# Patient Record
Sex: Female | Born: 1970 | Race: Black or African American | Hispanic: No | Marital: Married | Smoking: Current every day smoker
Health system: Southern US, Community
[De-identification: ages and names within clinical notes are randomized; demographics above are authoritative.]

## PROBLEM LIST (undated history)

## (undated) ENCOUNTER — Inpatient Hospital Stay: Admission: EM | Payer: Self-pay | Source: Home / Self Care

## (undated) DIAGNOSIS — K219 Gastro-esophageal reflux disease without esophagitis: Secondary | ICD-10-CM

## (undated) DIAGNOSIS — R079 Chest pain, unspecified: Secondary | ICD-10-CM

## (undated) DIAGNOSIS — Z8669 Personal history of other diseases of the nervous system and sense organs: Secondary | ICD-10-CM

## (undated) DIAGNOSIS — Z8619 Personal history of other infectious and parasitic diseases: Secondary | ICD-10-CM

## (undated) DIAGNOSIS — Z8659 Personal history of other mental and behavioral disorders: Secondary | ICD-10-CM

## (undated) DIAGNOSIS — Z87898 Personal history of other specified conditions: Secondary | ICD-10-CM

## (undated) DIAGNOSIS — F418 Other specified anxiety disorders: Secondary | ICD-10-CM

## (undated) HISTORY — DX: Personal history of other specified conditions: Z87.898

## (undated) HISTORY — DX: Personal history of other infectious and parasitic diseases: Z86.19

## (undated) HISTORY — DX: Other specified anxiety disorders: F41.8

## (undated) HISTORY — DX: Personal history of other mental and behavioral disorders: Z86.59

## (undated) HISTORY — DX: Gastro-esophageal reflux disease without esophagitis: K21.9

## (undated) HISTORY — DX: Personal history of other diseases of the nervous system and sense organs: Z86.69

---
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
## 1991-08-11 HISTORY — PX: TUBAL LIGATION: SHX77

## 1993-08-10 DIAGNOSIS — Z8659 Personal history of other mental and behavioral disorders: Secondary | ICD-10-CM

## 1993-08-10 HISTORY — DX: Personal history of other mental and behavioral disorders: Z86.59

## 2004-11-07 ENCOUNTER — Encounter: Admission: RE | Admit: 2004-11-07 | Discharge: 2004-11-07 | Payer: Self-pay | Admitting: Family Medicine

## 2004-12-30 ENCOUNTER — Emergency Department (HOSPITAL_COMMUNITY): Admission: EM | Admit: 2004-12-30 | Discharge: 2004-12-30 | Payer: Self-pay | Admitting: Emergency Medicine

## 2005-07-01 ENCOUNTER — Other Ambulatory Visit: Admission: RE | Admit: 2005-07-01 | Discharge: 2005-07-01 | Payer: Self-pay | Admitting: *Deleted

## 2007-09-07 ENCOUNTER — Other Ambulatory Visit: Admission: RE | Admit: 2007-09-07 | Discharge: 2007-09-07 | Payer: Self-pay | Admitting: Obstetrics and Gynecology

## 2008-10-25 ENCOUNTER — Other Ambulatory Visit: Admission: RE | Admit: 2008-10-25 | Discharge: 2008-10-25 | Payer: Self-pay | Admitting: Family Medicine

## 2008-12-01 ENCOUNTER — Emergency Department (HOSPITAL_BASED_OUTPATIENT_CLINIC_OR_DEPARTMENT_OTHER): Admission: EM | Admit: 2008-12-01 | Discharge: 2008-12-01 | Payer: Self-pay | Admitting: Emergency Medicine

## 2008-12-01 ENCOUNTER — Ambulatory Visit: Payer: Self-pay | Admitting: Diagnostic Radiology

## 2009-10-31 ENCOUNTER — Other Ambulatory Visit: Admission: RE | Admit: 2009-10-31 | Discharge: 2009-10-31 | Payer: Self-pay | Admitting: Family Medicine

## 2009-11-20 ENCOUNTER — Encounter: Admission: RE | Admit: 2009-11-20 | Discharge: 2009-11-20 | Payer: Self-pay | Admitting: Family Medicine

## 2009-11-22 ENCOUNTER — Encounter: Admission: RE | Admit: 2009-11-22 | Discharge: 2009-11-22 | Payer: Self-pay | Admitting: Family Medicine

## 2010-04-30 ENCOUNTER — Emergency Department (HOSPITAL_COMMUNITY): Admission: EM | Admit: 2010-04-30 | Discharge: 2010-04-30 | Payer: Self-pay | Admitting: Emergency Medicine

## 2010-10-23 LAB — POCT CARDIAC MARKERS

## 2010-11-12 ENCOUNTER — Other Ambulatory Visit: Payer: Self-pay | Admitting: Family Medicine

## 2010-11-12 ENCOUNTER — Other Ambulatory Visit (HOSPITAL_COMMUNITY)
Admission: RE | Admit: 2010-11-12 | Discharge: 2010-11-12 | Disposition: A | Discharge status: Home / Self Care | Payer: Self-pay | Source: Ambulatory Visit | Attending: Family Medicine | Admitting: Family Medicine

## 2010-11-12 DIAGNOSIS — Z124 Encounter for screening for malignant neoplasm of cervix: Secondary | ICD-10-CM | POA: Insufficient documentation

## 2010-11-17 ENCOUNTER — Emergency Department (HOSPITAL_BASED_OUTPATIENT_CLINIC_OR_DEPARTMENT_OTHER)
Admission: EM | Admit: 2010-11-17 | Discharge: 2010-11-18 | Disposition: A | Discharge status: Home / Self Care | Payer: Self-pay | Attending: Emergency Medicine | Admitting: Emergency Medicine

## 2010-11-17 ENCOUNTER — Emergency Department (INDEPENDENT_AMBULATORY_CARE_PROVIDER_SITE_OTHER): Payer: Self-pay

## 2010-11-17 DIAGNOSIS — R51 Headache: Secondary | ICD-10-CM

## 2010-11-17 DIAGNOSIS — F17200 Nicotine dependence, unspecified, uncomplicated: Secondary | ICD-10-CM | POA: Insufficient documentation

## 2010-11-17 LAB — PREGNANCY, URINE: Preg Test, Ur: NEGATIVE

## 2010-11-18 LAB — SEDIMENTATION RATE: Sed Rate: 5 mm/hr (ref 0–22)

## 2011-08-05 ENCOUNTER — Other Ambulatory Visit: Payer: Self-pay | Admitting: Internal Medicine

## 2011-08-05 DIAGNOSIS — Z1231 Encounter for screening mammogram for malignant neoplasm of breast: Secondary | ICD-10-CM

## 2011-08-06 ENCOUNTER — Encounter: Payer: Self-pay | Admitting: Internal Medicine

## 2011-08-06 ENCOUNTER — Telehealth: Payer: Self-pay | Admitting: Internal Medicine

## 2011-08-06 ENCOUNTER — Ambulatory Visit (INDEPENDENT_AMBULATORY_CARE_PROVIDER_SITE_OTHER): Payer: No Typology Code available for payment source | Admitting: Internal Medicine

## 2011-08-06 VITALS — BP 100/70 | HR 62 | Temp 98.0°F | Resp 16 | Ht 65.0 in | Wt 141.0 lb

## 2011-08-06 DIAGNOSIS — Z0000 Encounter for general adult medical examination without abnormal findings: Secondary | ICD-10-CM

## 2011-08-06 DIAGNOSIS — K219 Gastro-esophageal reflux disease without esophagitis: Secondary | ICD-10-CM

## 2011-08-06 DIAGNOSIS — R070 Pain in throat: Secondary | ICD-10-CM

## 2011-08-06 DIAGNOSIS — G43909 Migraine, unspecified, not intractable, without status migrainosus: Secondary | ICD-10-CM

## 2011-08-06 DIAGNOSIS — J309 Allergic rhinitis, unspecified: Secondary | ICD-10-CM

## 2011-08-06 MED ORDER — AZITHROMYCIN 250 MG PO TABS: ORAL_TABLET | ORAL | Status: AC

## 2011-08-06 MED ORDER — MONTELUKAST SODIUM 10 MG PO TABS: 10.0000 mg | ORAL_TABLET | Freq: Every day | ORAL | Status: DC

## 2011-08-06 MED ORDER — OMEPRAZOLE MAGNESIUM 20 MG PO TBEC: 20.0000 mg | DELAYED_RELEASE_TABLET | Freq: Every day | ORAL | Status: DC

## 2011-08-06 NOTE — Patient Instructions (Signed)
Please schedule cbc, chem7, lipid, lft, tsh, ua with reflex micro (v70.0) prior to your spring physical

## 2011-08-06 NOTE — Telephone Encounter (Signed)
Lab orders entered for April 2013. 

## 2011-08-06 NOTE — Progress Notes (Signed)
  Subjective:    Patient ID: Amy Bishop, female    DOB: [birth year], 40 y.o.   MRN: [id number]  HPI Pt presents to establish care and for evaluation of multiple medical problems. Notes 4d h/o ST, left ear pain without drainage and swollen neck glands. No fever, chills, cough. No alleviating or exacerbating factors. Rhinitis controlled with zyrtec and singulair. Pap smear nl spring 2012 and is pursuing q2 year repeats. Tetanus within last 10 y. Mammogram pending. States chol has been normal in the past. Declines flu vaccine.  Past Medical History  Diagnosis Date  . History of chicken pox     childhood  . Depression with anxiety   . History of anorexia nervosa 1995  . History of headache   . GERD (gastroesophageal reflux disease)   . Allergic rhinitis   . History of migraines    Past Surgical History  Procedure Date  . Cesarean section 72' & 10'  . Tubal ligation 1993  . Appendectomy 1980    reports that she has been smoking.  She has never used smokeless tobacco. She reports that she drinks alcohol. She reports that she does not use illicit drugs. family history includes Diabetes in her mother and Hypertension in her maternal grandmother and mother. Allergies  Allergen Reactions  . Bactrim     Hives  . Sulfa Drugs Cross Reactors      Review of Systems  Constitutional: Negative for fever and chills.  HENT: Positive for ear pain, congestion and sore throat. Negative for trouble swallowing.   Respiratory: Negative for cough and shortness of breath.   All other systems reviewed and are negative.       Objective:   Physical Exam  Nursing note and vitals reviewed. Constitutional: She appears well-developed and well-nourished. No distress.  HENT:  Head: Normocephalic and atraumatic.  Right Ear: Tympanic membrane, external ear and ear canal normal.  Left Ear: Tympanic membrane, external ear and ear canal normal.  Nose: Nose normal.  Mouth/Throat: Uvula is midline and  mucous membranes are normal. Posterior oropharyngeal erythema present. No oropharyngeal exudate or tonsillar abscesses.  Eyes: Conjunctivae are normal. Right eye exhibits no discharge. Left eye exhibits no discharge. No scleral icterus.  Neck: Neck supple.  Cardiovascular: Normal rate, regular rhythm and normal heart sounds.  Exam reveals no gallop and no friction rub.   No murmur heard. Pulmonary/Chest: Effort normal and breath sounds normal. No respiratory distress. She has no wheezes. She has no rales.  Lymphadenopathy:    She has no cervical adenopathy.  Neurological: She is alert.  Skin: Skin is warm and dry. She is not diaphoretic.  Psychiatric: She has a normal mood and affect.          Assessment & Plan:

## 2011-08-11 ENCOUNTER — Encounter: Payer: Self-pay | Admitting: Internal Medicine

## 2011-08-11 DIAGNOSIS — R070 Pain in throat: Secondary | ICD-10-CM | POA: Insufficient documentation

## 2011-08-11 DIAGNOSIS — K219 Gastro-esophageal reflux disease without esophagitis: Secondary | ICD-10-CM | POA: Insufficient documentation

## 2011-08-11 DIAGNOSIS — G43909 Migraine, unspecified, not intractable, without status migrainosus: Secondary | ICD-10-CM | POA: Insufficient documentation

## 2011-08-11 DIAGNOSIS — J309 Allergic rhinitis, unspecified: Secondary | ICD-10-CM | POA: Insufficient documentation

## 2011-08-11 NOTE — Assessment & Plan Note (Signed)
Rapid strep neg. zpak given to hold. Begin abx if no improvement after total duration of 7-10d. Followup if no improvement or worsening.

## 2011-08-11 NOTE — Assessment & Plan Note (Signed)
Stable. Ref singulair. F/u at spring cpe.

## 2011-08-12 ENCOUNTER — Ambulatory Visit (HOSPITAL_BASED_OUTPATIENT_CLINIC_OR_DEPARTMENT_OTHER): Payer: Self-pay

## 2011-08-20 ENCOUNTER — Ambulatory Visit: Payer: Self-pay

## 2011-11-27 ENCOUNTER — Encounter (HOSPITAL_BASED_OUTPATIENT_CLINIC_OR_DEPARTMENT_OTHER): Payer: Self-pay

## 2011-11-27 ENCOUNTER — Emergency Department (HOSPITAL_BASED_OUTPATIENT_CLINIC_OR_DEPARTMENT_OTHER)
Admission: EM | Admit: 2011-11-27 | Discharge: 2011-11-27 | Disposition: A | Discharge status: Home / Self Care | Payer: No Typology Code available for payment source | Attending: Emergency Medicine | Admitting: Emergency Medicine

## 2011-11-27 DIAGNOSIS — R070 Pain in throat: Secondary | ICD-10-CM | POA: Insufficient documentation

## 2011-11-27 DIAGNOSIS — M549 Dorsalgia, unspecified: Secondary | ICD-10-CM | POA: Insufficient documentation

## 2011-11-27 NOTE — ED Notes (Signed)
Pt states that she had episode of sore throat and bilateral lower back pain for the past few days.  Pt denies dysuria, n/v/d, c/o constipation, no fever.

## 2011-11-30 ENCOUNTER — Encounter: Payer: No Typology Code available for payment source | Admitting: Internal Medicine

## 2011-12-03 ENCOUNTER — Encounter: Payer: No Typology Code available for payment source | Admitting: Internal Medicine

## 2011-12-03 DIAGNOSIS — Z0289 Encounter for other administrative examinations: Secondary | ICD-10-CM

## 2012-10-01 ENCOUNTER — Other Ambulatory Visit: Payer: Self-pay | Admitting: Internal Medicine

## 2013-09-13 ENCOUNTER — Other Ambulatory Visit (HOSPITAL_COMMUNITY): Payer: Self-pay | Admitting: Endocrinology

## 2013-09-13 DIAGNOSIS — E213 Hyperparathyroidism, unspecified: Secondary | ICD-10-CM

## 2013-10-02 ENCOUNTER — Encounter (HOSPITAL_COMMUNITY)
Admission: RE | Admit: 2013-10-02 | Discharge: 2013-10-02 | Disposition: A | Discharge status: Home / Self Care | Payer: BC Managed Care – PPO | Source: Ambulatory Visit | Attending: Endocrinology | Admitting: Endocrinology

## 2013-10-02 ENCOUNTER — Ambulatory Visit (HOSPITAL_COMMUNITY)
Admission: RE | Admit: 2013-10-02 | Discharge: 2013-10-02 | Disposition: A | Discharge status: Home / Self Care | Payer: BC Managed Care – PPO | Source: Ambulatory Visit | Attending: Endocrinology | Admitting: Endocrinology

## 2013-10-02 DIAGNOSIS — E213 Hyperparathyroidism, unspecified: Secondary | ICD-10-CM | POA: Insufficient documentation

## 2013-10-02 MED ORDER — TECHNETIUM TC 99M SESTAMIBI - CARDIOLITE
26.1000 | Freq: Once | INTRAVENOUS | Status: AC | PRN
  Administered 2013-10-02: 26.1 via INTRAVENOUS

## 2013-11-01 ENCOUNTER — Encounter (INDEPENDENT_AMBULATORY_CARE_PROVIDER_SITE_OTHER): Payer: Self-pay | Admitting: Surgery

## 2013-11-01 ENCOUNTER — Ambulatory Visit (INDEPENDENT_AMBULATORY_CARE_PROVIDER_SITE_OTHER): Payer: BC Managed Care – PPO | Admitting: Surgery

## 2013-11-01 VITALS — BP 120/82 | HR 72 | Temp 98.1°F | Resp 14 | Ht 64.0 in | Wt 152.4 lb

## 2013-11-01 DIAGNOSIS — E210 Primary hyperparathyroidism: Secondary | ICD-10-CM | POA: Insufficient documentation

## 2013-11-01 NOTE — Progress Notes (Signed)
General Surgery [facility]  Chief Complaint  Patient presents with  . New Evaluation    eval primary hyperthyroidism - referral from Dr. Jacelyn Pi    HISTORY: Patient is a 43 year old female referred by her endocrinologist for evaluation of primary hyperparathyroidism. Patient was noted in November 2014 on routine laboratory studies to have an elevated serum calcium level. Calcium levels have ranged from 10.6-11.0. Intact PTH level was elevated at 190. Vitamin D level was normal at 35.  Patient subsequently underwent a nuclear medicine parathyroid scan in February 2015. This localizes a parathyroid adenoma to the right inferior position.  Patient has noted chronic fatigue. She has had abdominal pain. She notes bone and joint pain. She denies nephrolithiasis. She has had a normal bone density scan in the past.  Patient has had no prior head or neck surgery. She has never been on thyroid medication. There is no family history of endocrine disease and specifically no history of endocrine neoplasm.  Past Medical History  Diagnosis Date  . History of chicken pox     childhood  . Depression with anxiety   . History of anorexia nervosa 1995  . History of headache   . GERD (gastroesophageal reflux disease)   . Allergic rhinitis   . History of migraines     Current Outpatient Prescriptions  Medication Sig Dispense Refill  . baclofen (LIORESAL) 20 MG tablet Take 20 mg by mouth 3 (three) times daily.      Marland Kitchen buPROPion (WELLBUTRIN SR) 150 MG 12 hr tablet       . cetirizine (ZYRTEC) 10 MG tablet Take 10 mg by mouth daily as needed.        . cholecalciferol (VITAMIN D) 1000 UNITS tablet Take 1,000 Units by mouth 2 (two) times daily.      . montelukast (SINGULAIR) 10 MG tablet Take 1 tablet (10 mg total) by mouth at bedtime.  30 tablet  11  . omeprazole (PRILOSEC OTC) 20 MG tablet Take 1 tablet (20 mg total) by mouth daily.  30 tablet  11  . omeprazole (PRILOSEC) 20 MG  capsule        No current facility-administered medications for this visit.    Allergies  Allergen Reactions  . Bactrim     Hives  . Sulfa Drugs Cross Reactors     Family History  Problem Relation Age of Onset  . Hypertension Mother   . Diabetes Mother   . Hypertension Maternal Grandmother     History   Social History  . Marital Status: Married    Spouse Name: N/A    Number of Children: N/A  . Years of Education: N/A   Social History Main Topics  . Smoking status: Current Every Day Smoker -- 0.75 packs/day for 20 years    Types: Cigarettes  . Smokeless tobacco: Never Used  . Alcohol Use: Yes     Comment: occ  . Drug Use: No  . Sexual Activity: Yes    Birth Control/ Protection: Surgical   Other Topics Concern  . None   Social History Narrative  . None    REVIEW OF SYSTEMS - PERTINENT POSITIVES ONLY: Chronic fatigue. Chronic abdominal pain. Bone and joint pain. Denies nephrolithiasis. Denies fractures.  EXAM: Filed Vitals:   11/01/13 0955  BP: 120/82  Pulse: 72  Temp: 98.1 F (36.7 C)  Resp: 14    GENERAL: well-developed, well-nourished, no acute distress HEENT: normocephalic; pupils equal and reactive; sclerae clear; dentition good;  mucous membranes moist NECK:  No palpable masses in the thyroid bed; symmetric on extension; no palpable anterior or posterior cervical lymphadenopathy; no supraclavicular masses; no tenderness CHEST: clear to auscultation bilaterally without rales, rhonchi, or wheezes CARDIAC: regular rate and rhythm without significant murmur; peripheral pulses are full EXT:  non-tender without edema; no deformity NEURO: no gross focal deficits; no sign of tremor   LABORATORY RESULTS: See [facility] (CHL-Epic) for most recent results  RADIOLOGY RESULTS: See [facility] (CHL-Epic) for most recent results  IMPRESSION: Primary hyperparathyroidism, likely right inferior parathyroid adenoma  PLAN: The patient and I discussed  the above findings at length. We reviewed her laboratory studies and the sestamibi scan. We discussed minimally invasive surgery versus 4 gland exploration. I have recommended minimally invasive parathyroidectomy as an outpatient surgical procedure. We discussed the risk and benefits of the procedure. I provided her with written literature to review. Patient would like to proceed with surgery in the near future.  The risks and benefits of the procedure have been discussed at length with the patient.  The patient understands the proposed procedure, potential alternative treatments, and the course of recovery to be expected.  All of the patient's questions have been answered at this time.  The patient wishes to proceed with surgery.  Earnstine Regal, MD, [facility]  Primary Care Physician: Dennie Bible, PA-C

## 2013-11-01 NOTE — Patient Instructions (Signed)

## 2013-12-27 ENCOUNTER — Encounter (HOSPITAL_COMMUNITY): Payer: Self-pay | Admitting: Pharmacy Technician

## 2013-12-29 ENCOUNTER — Other Ambulatory Visit (HOSPITAL_COMMUNITY): Payer: Self-pay | Admitting: Anesthesiology

## 2013-12-29 NOTE — Progress Notes (Signed)
Chest 2 view xray 06-23-13 [facility] on chart ekg 06-01-13 [facility] on chart

## 2013-12-29 NOTE — Patient Instructions (Addendum)
[place]  12/29/2013   Your procedure is scheduled on: Thursday June 4th, 2015  Report to [facility] Main Entrance and follow signs to  [place] at 530 AM.  Call this number if you have problems the morning of surgery [phone]   Remember:  Do not eat food or drink liquids :After Midnight.     Take these medicines the morning of surgery with A SIP OF WATER: wellbutrin, prilosec                               You may not have any metal on your body including hair pins and piercings  Do not wear jewelry, make-up, lotions, powders, or deodorant.   Men may shave face and neck.  Do not bring valuables to the hospital. [place].  Contacts, dentures or bridgework may not be worn into surgery.  Leave suitcase in the car. After surgery it may be brought to your room.  For patients admitted to the hospital, checkout time is 11:00 AM the day of discharge.   Patients discharged the day of surgery will not be allowed to drive home.  Name and phone number of your driver:  Special Instructions: N/A  ________________________________________________________________________  [facility] - Preparing for Surgery Before surgery, you can play an important role.  Because skin is not sterile, your skin needs to be as free of germs as possible.  You can reduce the number of germs on your skin by washing with CHG (chlorahexidine gluconate) soap before surgery.  CHG is an antiseptic cleaner which kills germs and bonds with the skin to continue killing germs even after washing. Please DO NOT use if you have an allergy to CHG or antibacterial soaps.  If your skin becomes reddened/irritated stop using the CHG and inform your nurse when you arrive at Short Stay. Do not shave (including legs and underarms) for at least 48 hours prior to the first CHG shower.  You may shave your face/neck. Please follow these instructions carefully:  1.  Shower with  CHG Soap the night before surgery and the  morning of Surgery.  2.  If you choose to wash your hair, wash your hair first as usual with your  normal  shampoo.  3.  After you shampoo, rinse your hair and body thoroughly to remove the  shampoo.                           4.  Use CHG as you would any other liquid soap.  You can apply chg directly  to the skin and wash                       Gently with a scrungie or clean washcloth.  5.  Apply the CHG Soap to your body ONLY FROM THE NECK DOWN.   Do not use on face/ open                           Wound or open sores. Avoid contact with eyes, ears mouth and genitals (private parts).                       Wash face,  Genitals (private parts) with your normal soap.  6.  Wash thoroughly, paying special attention to the area where your surgery  will be performed.  7.  Thoroughly rinse your body with warm water from the neck down.  8.  DO NOT shower/wash with your normal soap after using and rinsing off  the CHG Soap.                9.  Pat yourself dry with a clean towel.            10.  Wear clean pajamas.            11.  Place clean sheets on your bed the night of your first shower and do not  sleep with pets. Day of Surgery : Do not apply any lotions/deodorants the morning of surgery.  Please wear clean clothes to the hospital/surgery center.  FAILURE TO FOLLOW THESE INSTRUCTIONS MAY RESULT IN THE CANCELLATION OF YOUR SURGERY PATIENT SIGNATURE_________________________________  NURSE SIGNATURE__________________________________  ________________________________________________________________________

## 2014-01-02 ENCOUNTER — Encounter (HOSPITAL_COMMUNITY)
Admission: RE | Admit: 2014-01-02 | Discharge: 2014-01-02 | Disposition: A | Discharge status: Home / Self Care | Payer: BC Managed Care – PPO | Source: Ambulatory Visit | Attending: Surgery | Admitting: Surgery

## 2014-01-02 ENCOUNTER — Encounter (HOSPITAL_COMMUNITY): Payer: Self-pay

## 2014-01-02 ENCOUNTER — Encounter (INDEPENDENT_AMBULATORY_CARE_PROVIDER_SITE_OTHER): Payer: Self-pay

## 2014-01-02 DIAGNOSIS — Z01812 Encounter for preprocedural laboratory examination: Secondary | ICD-10-CM | POA: Insufficient documentation

## 2014-01-02 DIAGNOSIS — Z01810 Encounter for preprocedural cardiovascular examination: Secondary | ICD-10-CM | POA: Insufficient documentation

## 2014-01-02 HISTORY — DX: Chest pain, unspecified: R07.9

## 2014-01-02 LAB — CBC
HEMATOCRIT: 36.2 % (ref 36.0–46.0)
HEMOGLOBIN: 11.3 g/dL — AB (ref 12.0–15.0)
MCH: 22.1 pg — ABNORMAL LOW (ref 26.0–34.0)
MCHC: 31.2 g/dL (ref 30.0–36.0)
MCV: 70.7 fL — AB (ref 78.0–100.0)
Platelets: 247 10*3/uL (ref 150–400)
RBC: 5.12 MIL/uL — ABNORMAL HIGH (ref 3.87–5.11)
RDW: 15.3 % (ref 11.5–15.5)
WBC: 6.9 10*3/uL (ref 4.0–10.5)

## 2014-01-02 LAB — BASIC METABOLIC PANEL
BUN: 14 mg/dL (ref 6–23)
CO2: 27 meq/L (ref 19–32)
Calcium: 11 mg/dL — ABNORMAL HIGH (ref 8.4–10.5)
Chloride: 105 mEq/L (ref 96–112)
Creatinine, Ser: 0.9 mg/dL (ref 0.50–1.10)
GFR calc Af Amer: 89 mL/min — ABNORMAL LOW (ref >90)
GFR calc non Af Amer: 77 mL/min — ABNORMAL LOW (ref >90)
GLUCOSE: 85 mg/dL (ref 70–99)
POTASSIUM: 4.5 meq/L (ref 3.7–5.3)
SODIUM: 142 meq/L (ref 137–147)

## 2014-01-02 LAB — HCG, SERUM, QUALITATIVE: Preg, Serum: NEGATIVE

## 2014-01-02 NOTE — Progress Notes (Signed)
Quick Note:  EKG is acceptable for scheduled surgery.  Kaj Vasil M. Nikolis Berent, MD, FACS [facility] Office: [phone]   ______ 

## 2014-01-02 NOTE — Progress Notes (Signed)
Patient c/o chest pain few days ago, ekg repeated at pre op visit 01-02-14

## 2014-01-02 NOTE — Progress Notes (Signed)
Quick Note:  These results are acceptable for scheduled surgery.  Pretty Weltman M. Jaqualin Serpa, MD, FACS [facility] Office: [phone]   ______ 

## 2014-01-10 NOTE — Anesthesia Preprocedure Evaluation (Addendum)
Anesthesia Evaluation  Patient identified by MRN, date of birth, ID band Patient awake    Reviewed: Allergy & Precautions, H&P , NPO status , Patient's Chart, lab work & pertinent test results  Airway Mallampati: II TM Distance: >3 FB Neck ROM: Full    Dental no notable dental hx.    Pulmonary Current Smoker,  breath sounds clear to auscultation  Pulmonary exam normal       Cardiovascular negative cardio ROS  Rhythm:Regular Rate:Normal     Neuro/Psych negative neurological ROS  negative psych ROS   GI/Hepatic negative GI ROS, Neg liver ROS, GERD-  Medicated,  Endo/Other  negative endocrine ROS  Renal/GU negative Renal ROS  negative genitourinary   Musculoskeletal negative musculoskeletal ROS (+)   Abdominal   Peds negative pediatric ROS (+)  Hematology negative hematology ROS (+)   Anesthesia Other Findings   Reproductive/Obstetrics negative OB ROS                          Anesthesia Physical Anesthesia Plan  ASA: II  Anesthesia Plan: General   Post-op Pain Management:    Induction: Intravenous  Airway Management Planned: Oral ETT  Additional Equipment:   Intra-op Plan:   Post-operative Plan: Extubation in OR  Informed Consent: I have reviewed the patients History and Physical, chart, labs and discussed the procedure including the risks, benefits and alternatives for the proposed anesthesia with the patient or authorized representative who has indicated his/her understanding and acceptance.   Dental advisory given  Plan Discussed with: CRNA and Surgeon  Anesthesia Plan Comments:         Anesthesia Quick Evaluation

## 2014-01-11 ENCOUNTER — Telehealth (INDEPENDENT_AMBULATORY_CARE_PROVIDER_SITE_OTHER): Payer: Self-pay

## 2014-01-11 ENCOUNTER — Telehealth (INDEPENDENT_AMBULATORY_CARE_PROVIDER_SITE_OTHER): Payer: Self-pay | Admitting: General Surgery

## 2014-01-11 ENCOUNTER — Encounter (HOSPITAL_COMMUNITY): Payer: BC Managed Care – PPO | Admitting: Anesthesiology

## 2014-01-11 ENCOUNTER — Encounter (HOSPITAL_COMMUNITY): Payer: Self-pay | Admitting: *Deleted

## 2014-01-11 ENCOUNTER — Ambulatory Visit (HOSPITAL_COMMUNITY)
Admission: RE | Admit: 2014-01-11 | Discharge: 2014-01-11 | Disposition: A | Discharge status: Home / Self Care | Payer: BC Managed Care – PPO | Source: Ambulatory Visit | Attending: Surgery | Admitting: Surgery

## 2014-01-11 ENCOUNTER — Ambulatory Visit (HOSPITAL_COMMUNITY): Payer: BC Managed Care – PPO | Admitting: Anesthesiology

## 2014-01-11 ENCOUNTER — Other Ambulatory Visit (INDEPENDENT_AMBULATORY_CARE_PROVIDER_SITE_OTHER): Payer: Self-pay

## 2014-01-11 ENCOUNTER — Encounter (HOSPITAL_COMMUNITY)
Admission: RE | Disposition: A | Discharge status: Home / Self Care | Payer: Self-pay | Source: Ambulatory Visit | Attending: Surgery

## 2014-01-11 DIAGNOSIS — F341 Dysthymic disorder: Secondary | ICD-10-CM | POA: Insufficient documentation

## 2014-01-11 DIAGNOSIS — D351 Benign neoplasm of parathyroid gland: Secondary | ICD-10-CM

## 2014-01-11 DIAGNOSIS — K219 Gastro-esophageal reflux disease without esophagitis: Secondary | ICD-10-CM | POA: Insufficient documentation

## 2014-01-11 DIAGNOSIS — E210 Primary hyperparathyroidism: Secondary | ICD-10-CM | POA: Insufficient documentation

## 2014-01-11 DIAGNOSIS — Z79899 Other long term (current) drug therapy: Secondary | ICD-10-CM | POA: Insufficient documentation

## 2014-01-11 DIAGNOSIS — F17200 Nicotine dependence, unspecified, uncomplicated: Secondary | ICD-10-CM | POA: Insufficient documentation

## 2014-01-11 HISTORY — PX: PARATHYROIDECTOMY: SHX19

## 2014-01-11 SURGERY — PARATHYROIDECTOMY
Anesthesia: General | Site: Neck | Laterality: Right

## 2014-01-11 MED ORDER — LIDOCAINE HCL (CARDIAC) 20 MG/ML IV SOLN
INTRAVENOUS | Status: DC | PRN
  Administered 2014-01-11: 100 mg via INTRAVENOUS

## 2014-01-11 MED ORDER — PROMETHAZINE HCL 25 MG/ML IJ SOLN
6.2500 mg | Freq: Once | INTRAMUSCULAR | Status: DC
  Filled 2014-01-11: qty 1

## 2014-01-11 MED ORDER — METOCLOPRAMIDE HCL 5 MG/ML IJ SOLN
10.0000 mg | Freq: Once | INTRAMUSCULAR | Status: DC
  Filled 2014-01-11: qty 2

## 2014-01-11 MED ORDER — SUCCINYLCHOLINE CHLORIDE 20 MG/ML IJ SOLN
INTRAMUSCULAR | Status: DC | PRN
  Administered 2014-01-11: 100 mg via INTRAVENOUS

## 2014-01-11 MED ORDER — LACTATED RINGERS IV SOLN
INTRAVENOUS | Status: DC
  Administered 2014-01-11: 09:00:00 via INTRAVENOUS

## 2014-01-11 MED ORDER — ACETAMINOPHEN 10 MG/ML IV SOLN
1000.0000 mg | Freq: Once | INTRAVENOUS | Status: AC
  Administered 2014-01-11: 1000 mg via INTRAVENOUS
  Filled 2014-01-11: qty 100

## 2014-01-11 MED ORDER — FENTANYL CITRATE 0.05 MG/ML IJ SOLN
INTRAMUSCULAR | Status: DC | PRN
  Administered 2014-01-11 (×5): 50 ug via INTRAVENOUS

## 2014-01-11 MED ORDER — HYDROMORPHONE HCL PF 1 MG/ML IJ SOLN
INTRAMUSCULAR | Status: AC
  Filled 2014-01-11: qty 1

## 2014-01-11 MED ORDER — NEOSTIGMINE METHYLSULFATE 10 MG/10ML IV SOLN
INTRAVENOUS | Status: DC | PRN
  Administered 2014-01-11: 4 mg via INTRAVENOUS

## 2014-01-11 MED ORDER — ONDANSETRON HCL 4 MG/2ML IJ SOLN
INTRAMUSCULAR | Status: DC | PRN
  Administered 2014-01-11: 4 mg via INTRAVENOUS

## 2014-01-11 MED ORDER — HYDROCODONE-ACETAMINOPHEN 5-325 MG PO TABS
1.0000 | ORAL_TABLET | ORAL | Status: DC | PRN
  Administered 2014-01-11: 1 via ORAL
  Filled 2014-01-11: qty 1

## 2014-01-11 MED ORDER — FENTANYL CITRATE 0.05 MG/ML IJ SOLN
INTRAMUSCULAR | Status: AC
  Filled 2014-01-11: qty 5

## 2014-01-11 MED ORDER — LACTATED RINGERS IV SOLN
INTRAVENOUS | Status: DC | PRN
  Administered 2014-01-11: 07:00:00 via INTRAVENOUS

## 2014-01-11 MED ORDER — BUPIVACAINE HCL (PF) 0.25 % IJ SOLN
INTRAMUSCULAR | Status: AC
  Filled 2014-01-11: qty 30

## 2014-01-11 MED ORDER — GLYCOPYRROLATE 0.2 MG/ML IJ SOLN
INTRAMUSCULAR | Status: DC | PRN
  Administered 2014-01-11: 0.6 mg via INTRAVENOUS

## 2014-01-11 MED ORDER — DEXAMETHASONE SODIUM PHOSPHATE 10 MG/ML IJ SOLN
INTRAMUSCULAR | Status: DC | PRN
Start: 2014-01-11 — End: 2014-01-11
  Administered 2014-01-11: 10 mg via INTRAVENOUS

## 2014-01-11 MED ORDER — ONDANSETRON HCL 4 MG/2ML IJ SOLN
INTRAMUSCULAR | Status: AC
  Filled 2014-01-11: qty 2

## 2014-01-11 MED ORDER — GLYCOPYRROLATE 0.2 MG/ML IJ SOLN
INTRAMUSCULAR | Status: AC
  Filled 2014-01-11: qty 3

## 2014-01-11 MED ORDER — LIDOCAINE HCL (CARDIAC) 20 MG/ML IV SOLN
INTRAVENOUS | Status: AC
  Filled 2014-01-11: qty 5

## 2014-01-11 MED ORDER — PROPOFOL 10 MG/ML IV BOLUS
INTRAVENOUS | Status: AC
  Filled 2014-01-11: qty 20

## 2014-01-11 MED ORDER — HYDROMORPHONE HCL PF 1 MG/ML IJ SOLN
0.2500 mg | INTRAMUSCULAR | Status: DC | PRN
  Administered 2014-01-11 (×3): 0.5 mg via INTRAVENOUS

## 2014-01-11 MED ORDER — ROCURONIUM BROMIDE 100 MG/10ML IV SOLN
INTRAVENOUS | Status: DC | PRN
  Administered 2014-01-11: 5 mg via INTRAVENOUS
  Administered 2014-01-11: 20 mg via INTRAVENOUS

## 2014-01-11 MED ORDER — CEFAZOLIN SODIUM-DEXTROSE 2-3 GM-% IV SOLR
INTRAVENOUS | Status: AC
  Filled 2014-01-11: qty 50

## 2014-01-11 MED ORDER — DEXAMETHASONE SODIUM PHOSPHATE 10 MG/ML IJ SOLN
INTRAMUSCULAR | Status: AC
  Filled 2014-01-11: qty 1

## 2014-01-11 MED ORDER — PROMETHAZINE HCL 25 MG/ML IJ SOLN: 6.2500 mg | INTRAMUSCULAR | Status: DC | PRN

## 2014-01-11 MED ORDER — BUPIVACAINE HCL 0.25 % IJ SOLN
INTRAMUSCULAR | Status: DC | PRN
  Administered 2014-01-11: 10 mL

## 2014-01-11 MED ORDER — CEFAZOLIN SODIUM-DEXTROSE 2-3 GM-% IV SOLR
2.0000 g | INTRAVENOUS | Status: AC
  Administered 2014-01-11: 2 g via INTRAVENOUS

## 2014-01-11 MED ORDER — HYDROCODONE-ACETAMINOPHEN 5-325 MG PO TABS: 1.0000 | ORAL_TABLET | ORAL | Status: DC | PRN

## 2014-01-11 MED ORDER — MIDAZOLAM HCL 2 MG/2ML IJ SOLN
INTRAMUSCULAR | Status: AC
  Filled 2014-01-11: qty 2

## 2014-01-11 MED ORDER — PROPOFOL 10 MG/ML IV BOLUS
INTRAVENOUS | Status: DC | PRN
  Administered 2014-01-11: 150 mg via INTRAVENOUS

## 2014-01-11 MED ORDER — MIDAZOLAM HCL 5 MG/5ML IJ SOLN
INTRAMUSCULAR | Status: DC | PRN
  Administered 2014-01-11: 2 mg via INTRAVENOUS

## 2014-01-11 SURGICAL SUPPLY — 41 items
APL SKNCLS STERI-STRIP NONHPOA (GAUZE/BANDAGES/DRESSINGS) ×1
ATTRACTOMAT 16X20 MAGNETIC DRP (DRAPES) ×3 IMPLANT
BENZOIN TINCTURE PRP APPL 2/3 (GAUZE/BANDAGES/DRESSINGS) ×3 IMPLANT
BLADE HEX COATED 2.75 (ELECTRODE) ×3 IMPLANT
BLADE SURG 15 STRL LF DISP TIS (BLADE) ×1 IMPLANT
BLADE SURG 15 STRL SS (BLADE) ×3
CANISTER SUCTION 2500CC (MISCELLANEOUS) ×1 IMPLANT
CHLORAPREP W/TINT 10.5 ML (MISCELLANEOUS) ×3 IMPLANT
CLIP TI MEDIUM 6 (CLIP) ×4 IMPLANT
CLIP TI WIDE RED SMALL 6 (CLIP) ×4 IMPLANT
CLOSURE WOUND 1/2 X4 (GAUZE/BANDAGES/DRESSINGS) ×1
DRAPE PED LAPAROTOMY (DRAPES) ×3 IMPLANT
DRESSING SURGICEL FIBRLLR 1X2 (HEMOSTASIS) ×1 IMPLANT
DRSG SURGICEL FIBRILLAR 1X2 (HEMOSTASIS) ×3
ELECT REM PT RETURN 9FT ADLT (ELECTROSURGICAL) ×3
ELECTRODE REM PT RTRN 9FT ADLT (ELECTROSURGICAL) ×1 IMPLANT
GAUZE SPONGE 4X4 16PLY XRAY LF (GAUZE/BANDAGES/DRESSINGS) ×3 IMPLANT
GLOVE SURG ORTHO 8.0 STRL STRW (GLOVE) ×3 IMPLANT
GOWN STRL REUS W/TWL XL LVL3 (GOWN DISPOSABLE) ×7 IMPLANT
KIT BASIN OR (CUSTOM PROCEDURE TRAY) ×3 IMPLANT
NDL HYPO 25X1 1.5 SAFETY (NEEDLE) ×1 IMPLANT
NEEDLE HYPO 25X1 1.5 SAFETY (NEEDLE) ×3 IMPLANT
NS IRRIG 1000ML POUR BTL (IV SOLUTION) ×3 IMPLANT
PACK BASIC VI WITH GOWN DISP (CUSTOM PROCEDURE TRAY) ×3 IMPLANT
PAD TELFA 2X3 NADH STRL (GAUZE/BANDAGES/DRESSINGS) ×1 IMPLANT
PENCIL BUTTON HOLSTER BLD 10FT (ELECTRODE) ×3 IMPLANT
SPONGE GAUZE 4X4 12PLY (GAUZE/BANDAGES/DRESSINGS) ×2 IMPLANT
STAPLER VISISTAT 35W (STAPLE) ×1 IMPLANT
STRIP CLOSURE SKIN 1/2X4 (GAUZE/BANDAGES/DRESSINGS) ×2 IMPLANT
SUT MNCRL AB 4-0 PS2 18 (SUTURE) ×3 IMPLANT
SUT SILK 2 0 (SUTURE)
SUT SILK 2-0 18XBRD TIE 12 (SUTURE) IMPLANT
SUT SILK 3 0 (SUTURE)
SUT SILK 3-0 18XBRD TIE 12 (SUTURE) IMPLANT
SUT VIC AB 3-0 SH 18 (SUTURE) ×3 IMPLANT
SYR BULB IRRIGATION 50ML (SYRINGE) ×3 IMPLANT
SYR CONTROL 10ML LL (SYRINGE) ×3 IMPLANT
TAPE CLOTH SOFT 2X10 (GAUZE/BANDAGES/DRESSINGS) ×2 IMPLANT
TOWEL OR 17X26 10 PK STRL BLUE (TOWEL DISPOSABLE) ×3 IMPLANT
TOWEL OR NON WOVEN STRL DISP B (DISPOSABLE) ×3 IMPLANT
YANKAUER SUCT BULB TIP 10FT TU (MISCELLANEOUS) ×3 IMPLANT

## 2014-01-11 NOTE — Op Note (Signed)
OPERATIVE REPORT - PARATHYROIDECTOMY  Preoperative diagnosis: Primary hyperparathyroidism  Postop diagnosis: Same  Procedure: Right inferior minimally invasive parathyroidectomy  Surgeon:  Earnstine Regal, MD, FACS  Anesthesia: Gen. endotracheal  Estimated blood loss: Minimal  Preparation: ChloraPrep  Indications: Patient is a 43 year old female referred by her endocrinologist for evaluation of primary hyperparathyroidism. Patient was noted in November 2014 on routine laboratory studies to have an elevated serum calcium level. Calcium levels have ranged from 10.6-11.0. Intact PTH level was elevated at 190. Vitamin D level was normal at 35. Patient subsequently underwent a nuclear medicine parathyroid scan in February 2015. This localizes a parathyroid adenoma to the right inferior position.   Procedure: Patient was prepared in the holding area. He was brought to operating room and placed in a supine position on the operating room table. Following administration of general anesthesia, the patient was positioned and then prepped and draped in the usual strict aseptic fashion. After ascertaining that an adequate level of anesthesia been achieved, a neck incision was made with a #15 blade. Dissection was carried through subcutaneous tissues and platysma. Hemostasis was obtained with the electrocautery. Skin flaps were developed circumferentially and a Weitlander retractor was placed for exposure.  Strap muscles were incised in the midline. Strap muscles were reflected exposing the thyroid lobe. With gentle blunt dissection the thyroid lobe was mobilized.  Dissection was carried through adipose tissue.  The carotid sheath was briefly explored.  The retro-esophageal space was opened and explored. An enlarged parathyroid gland was identified on the posterior aspect of the lower pole of the thyroid gland.  It was partially intra-thyroidal. It was gently mobilized. Vascular structures were divided between  medium ligaclips. Care was taken to avoid the recurrent laryngeal nerve and the esophagus. The parathyroid gland was completely excised. It was submitted to pathology where frozen section confirmed parathyroid tissue consistent with adenoma.  Neck was irrigated with warm saline and good hemostasis was noted. Fibrillar was placed in the operative field. Strap muscles were reapproximated in the midline with interrupted 3-0 Vicryl sutures. Platysma was closed with interrupted 3-0 Vicryl sutures. Skin was closed with a running 4-0 Monocryl subcuticular suture. Marcaine was infiltrated circumferentially. Wound was washed and dried and benzoin and Steri-Strips were applied. Sterile gauze dressings were applied. Patient was awakened from anesthesia and brought to the recovery room. The patient tolerated the procedure well.   Earnstine Regal, MD, [facility]

## 2014-01-11 NOTE — H&P (Signed)
Amy Bishop is an 43 y.o. female.    General Surgery [facility]  Chief Complaint: primary hyperparathyroidism  HPI: Patient is a 43 year old female referred by her endocrinologist for evaluation of primary hyperparathyroidism. Patient was noted in November 2014 on routine laboratory studies to have an elevated serum calcium level. Calcium levels have ranged from 10.6-11.0. Intact PTH level was elevated at 190. Vitamin D level was normal at 35.  Patient subsequently underwent a nuclear medicine parathyroid scan in February 2015. This localizes a parathyroid adenoma to the right inferior position.  Patient has noted chronic fatigue. She has had abdominal pain. She notes bone and joint pain. She denies nephrolithiasis. She has had a normal bone density scan in the past.  Patient has had no prior head or neck surgery. She has never been on thyroid medication. There is no family history of endocrine disease and specifically no history of endocrine neoplasm.    Past Medical History  Diagnosis Date  . History of chicken pox     childhood  . Depression with anxiety   . History of anorexia nervosa 1995  . History of headache   . GERD (gastroesophageal reflux disease)   . Allergic rhinitis   . History of migraines   . Chest pain     pt thinks due to ran out of prilosec    Past Surgical History  Procedure Laterality Date  . Cesarean section  88' & 92'  . Tubal ligation  1993  . Appendectomy  1980    Family History  Problem Relation Age of Onset  . Hypertension Mother   . Diabetes Mother   . Hypertension Maternal Grandmother    Social History:  reports that she has been smoking Cigarettes.  She has a 15 pack-year smoking history. She has never used smokeless tobacco. She reports that she drinks alcohol. She reports that she does not use illicit drugs.  Allergies:  Allergies  Allergen Reactions  . Other Anaphylaxis    Patient has an unknown allergy that  causes anaphylaxis.Could not be determined by allergy Dr.  . Maryann Alar     Hives  . Sulfa Drugs Cross Reactors Hives    Medications Prior to Admission  Medication Sig Dispense Refill  . baclofen (LIORESAL) 20 MG tablet Take 20 mg by mouth 3 (three) times daily as needed for muscle spasms.       Marland Kitchen buPROPion (WELLBUTRIN SR) 150 MG 12 hr tablet Take 150 mg by mouth every morning.       Marland Kitchen ibuprofen (ADVIL,MOTRIN) 200 MG tablet Take 400 mg by mouth every 6 (six) hours as needed for mild pain.      . montelukast (SINGULAIR) 10 MG tablet Take 1 tablet (10 mg total) by mouth at bedtime.  30 tablet  11  . naproxen sodium (ANAPROX) 220 MG tablet Take 220 mg by mouth 2 (two) times daily as needed (pain).      Marland Kitchen omeprazole (PRILOSEC) 20 MG capsule Take 20 mg by mouth 2 (two) times daily.       Marland Kitchen acetaminophen (TYLENOL) 500 MG tablet Take 1,000 mg by mouth every 6 (six) hours as needed for mild pain.      . cetirizine (ZYRTEC) 10 MG tablet Take 10 mg by mouth daily.       . Cholecalciferol (VITAMIN D) 2000 UNITS tablet Take 2,000 Units by mouth daily.      Marland Kitchen EPINEPHrine (EPIPEN IJ) Inject as directed as needed.  No results found for this or any previous visit (from the past 48 hour(s)). No results found.  Review of Systems  Constitutional: Positive for malaise/fatigue.  HENT: Negative.   Eyes: Negative.   Respiratory: Negative.   Cardiovascular: Negative.   Gastrointestinal: Positive for abdominal pain.  Genitourinary: Negative.   Musculoskeletal: Positive for joint pain.  Skin: Negative.   Neurological: Negative.   Endo/Heme/Allergies: Negative.   Psychiatric/Behavioral: Negative.     Blood pressure 113/76, pulse 68, temperature 97.7 F (36.5 C), temperature source Oral, resp. rate 18, last menstrual period 12/22/2013, SpO2 100.00%. Physical Exam  Constitutional: She is oriented to person, place, and time. She appears well-developed and well-nourished. No distress.  HENT:  Head:  Normocephalic and atraumatic.  Right Ear: External ear normal.  Left Ear: External ear normal.  Mouth/Throat: No oropharyngeal exudate.  Eyes: Conjunctivae are normal. Pupils are equal, round, and reactive to light. No scleral icterus.  Neck: Normal range of motion. Neck supple. No thyromegaly present.  Cardiovascular: Normal rate, regular rhythm and normal heart sounds.   Respiratory: Effort normal and breath sounds normal.  GI: Soft. Bowel sounds are normal. She exhibits no distension. There is no tenderness.  Musculoskeletal: Normal range of motion. She exhibits no edema.  Neurological: She is alert and oriented to person, place, and time.  Skin: Skin is warm and dry.  Psychiatric: She has a normal mood and affect. Her behavior is normal.     Assessment/Plan Primary hyperparathyroidism, likely right inferior gland  Plan right inferior minimally invasive parathyroidectomy  The risks and benefits of the procedure have been discussed at length with the patient.  The patient understands the proposed procedure, potential alternative treatments, and the course of recovery to be expected.  All of the patient's questions have been answered at this time.  The patient wishes to proceed with surgery.  Earnstine Regal, MD, [facility] Office: [place] 01/11/2014, 7:18 AM

## 2014-01-11 NOTE — Anesthesia Postprocedure Evaluation (Signed)
  Anesthesia Post-op Note  Patient: Amy Bishop  Procedure(s) Performed: Procedure(s) (LRB): RIGHT INFERIOR PARATHYROIDECTOMY  (Right)  Patient Location: PACU  Anesthesia Type: General  Level of Consciousness: awake and alert   Airway and Oxygen Therapy: Patient Spontanous Breathing  Post-op Pain: mild  Post-op Assessment: Post-op Vital signs reviewed, Patient's Cardiovascular Status Stable, Respiratory Function Stable, Patent Airway and No signs of Nausea or vomiting  Last Vitals:  Filed Vitals:   01/11/14 1000  BP: 140/76  Pulse: 71  Temp: 36.5 C  Resp: 16    Post-op Vital Signs: stable   Complications: No apparent anesthesia complications

## 2014-01-11 NOTE — Progress Notes (Signed)
Complains with nausea adn shortness of breath. O2 sat 100%. Up to BR to void. Now feels much better. Nausea relieved without taking nausea med. Eating applesauce with pain pill.

## 2014-01-11 NOTE — Transfer of Care (Signed)
Immediate Anesthesia Transfer of Care Note  Patient: Amy Bishop  Procedure(s) Performed: Procedure(s): RIGHT INFERIOR PARATHYROIDECTOMY  (Right)  Patient Location: PACU  Anesthesia Type:General  Level of Consciousness: sedated  Airway & Oxygen Therapy: Patient Spontanous Breathing and Patient connected to face mask oxygen  Post-op Assessment: Report given to PACU RN and Post -op Vital signs reviewed and stable  Post vital signs: Reviewed and stable  Complications: No apparent anesthesia complications

## 2014-01-11 NOTE — Telephone Encounter (Signed)
Mr. Derocher called about his wife. She had a right inferior parathyroidectomy today.  She's been coughing up some phlegm and has occasional shortness of breath. I asked him to look at her bandage and there is no significant swelling. I recommended she stay upright tonight and to place ice on the area. I heard her speaking and she did not sound like she was in any acute distress.

## 2014-01-11 NOTE — Telephone Encounter (Signed)
Lab slip mailed to pt. Will call pt with ov once we have date from Dr Harlow Asa.

## 2014-01-12 ENCOUNTER — Encounter (HOSPITAL_COMMUNITY): Payer: Self-pay | Admitting: Surgery

## 2014-01-15 ENCOUNTER — Telehealth (INDEPENDENT_AMBULATORY_CARE_PROVIDER_SITE_OTHER): Payer: Self-pay

## 2014-01-15 DIAGNOSIS — E892 Postprocedural hypoparathyroidism: Secondary | ICD-10-CM

## 2014-01-15 DIAGNOSIS — Z9889 Other specified postprocedural states: Secondary | ICD-10-CM

## 2014-01-15 MED ORDER — TRAMADOL HCL 50 MG PO TABS: 50.0000 mg | ORAL_TABLET | Freq: Four times a day (QID) | ORAL | Status: DC | PRN

## 2014-01-15 NOTE — Telephone Encounter (Signed)
Pt s/p right inferior parathyroidectomy on 01/11/14. Pt states that she would like to get a refill on her pain meds, per protocol will order her Tramadol 50mg  1-2 tablets every 4-6 hours PRN. Pt rates her pain a 4 at this time. Pt states that yesterday she had some numbness in her fingertips. Pt states that this has gone away today. Informed pt that if this came back, to give [place] a call back. Pt states that she has felt some SOB, informed pt that is this also continues to contact her PCP.  Pt needs to schedule a postop appt. Informed her that Jenny Reichmann is working on making some clinics for Dr Harlow Asa and she would get back in touch with the pt, with this appt. Informed pt that she may pick up the Rx at our front desk. Pt verbalized understanding and agrees with POC.

## 2014-01-16 ENCOUNTER — Ambulatory Visit
Admission: RE | Admit: 2014-01-16 | Discharge: 2014-01-16 | Disposition: A | Discharge status: Home / Self Care | Payer: BC Managed Care – PPO | Source: Ambulatory Visit | Attending: Surgery | Admitting: Surgery

## 2014-01-16 ENCOUNTER — Other Ambulatory Visit (INDEPENDENT_AMBULATORY_CARE_PROVIDER_SITE_OTHER): Payer: Self-pay

## 2014-01-16 DIAGNOSIS — E892 Postprocedural hypoparathyroidism: Secondary | ICD-10-CM

## 2014-01-16 DIAGNOSIS — Z9889 Other specified postprocedural states: Secondary | ICD-10-CM

## 2014-01-16 LAB — CALCIUM: Calcium: 9.3 mg/dL (ref 8.4–10.5)

## 2014-01-16 NOTE — Telephone Encounter (Signed)
Pt calling back today for increased numbness in her hand and fingertips. Pt states that she is also having more SOB than before, the longer she talked the more SOB she was. Pt states she has not contacted her PCP and does not want to call them at this time. Spoke with Dr Harlow Asa he would like for the pt to have a STAT Ca level drawn and also a chest xray. Informed pt of Dr Gala Lewandowsky recommendations. Pt states that she will go by [facility] to have this done today. Informed pt that Dr Harlow Asa states that he will look at these results and we will get back in touch with her this afternoon. Pt verbalized understanding and agrees with POC.

## 2014-01-17 ENCOUNTER — Telehealth (INDEPENDENT_AMBULATORY_CARE_PROVIDER_SITE_OTHER): Payer: Self-pay

## 2014-01-17 NOTE — Telephone Encounter (Signed)
Pt given po appt date. Pt advised per Dr Gala Lewandowsky request that cxr and calcium normal. Pt will contact her pcp if symptoms return. Pt states symptoms are much improved.

## 2014-01-17 NOTE — Telephone Encounter (Signed)
Calcium level and CXR are normal.  No obvious cause for patient's symptoms.  If symptoms persist, she should see her primary care physician.  Will evaluate at her post op visit here in the office.  Earnstine Regal, MD, [facility] Office: [phone]

## 2014-01-17 NOTE — Progress Notes (Signed)
Quick Note:  Post op calcium level is normal. Not the cause of the patient's symptoms.  Will evaluate at post op appointment in office.  Earnstine Regal, MD, [facility] Office: [phone]   ______

## 2014-01-17 NOTE — Progress Notes (Signed)
Quick Note:  CXR is clear. No obvious cause for shortness of breath.  Patient should see primary care physician if symptoms persist.  Will evaluate at post op visit here in the office.   Amy Regal, MD, [facility] Office: [phone]   ______

## 2014-01-24 ENCOUNTER — Ambulatory Visit (INDEPENDENT_AMBULATORY_CARE_PROVIDER_SITE_OTHER): Payer: BC Managed Care – PPO | Admitting: Surgery

## 2014-01-24 ENCOUNTER — Encounter (INDEPENDENT_AMBULATORY_CARE_PROVIDER_SITE_OTHER): Payer: Self-pay | Admitting: Surgery

## 2014-01-24 VITALS — BP 118/72 | HR 78 | Temp 97.2°F | Resp 18 | Ht 65.0 in | Wt 145.0 lb

## 2014-01-24 DIAGNOSIS — E210 Primary hyperparathyroidism: Secondary | ICD-10-CM

## 2014-01-24 NOTE — Progress Notes (Signed)
General Surgery [facility]  Chief Complaint  Patient presents with  . Routine Post Op    post op parathyroidectomy 01/11/2014    HISTORY: Patient is a 43 year old female who underwent minimally invasive parathyroidectomy on 01/11/2014. Postoperative calcium level is normal at 9.3. Patient returns today for first postoperative wound check.  EXAM: Surgical wound is healing nicely. Remaining Steri-Strips are removed. Minimal soft tissue swelling. No sign of seroma. No sign of infection. Voice quality is normal.  IMPRESSION: Status post minimally invasive parathyroidectomy  PLAN: Patient will begin applying topical creams to her incision. She will return in 6 weeks for final wound check. We will check a calcium level and intact parathyroid hormone level prior to that office visit.  Earnstine Regal, MD, [facility]   Visit Diagnoses: 1. Hyperparathyroidism, primary

## 2014-01-24 NOTE — Addendum Note (Signed)
Addended by: Dois Davenport on: 01/24/2014 01:12 PM   Modules accepted: Orders

## 2014-01-24 NOTE — Patient Instructions (Signed)
  CARE OF INCISION   Apply cocoa butter/vitamin E cream (Palmer's brand) to your incision 2 - 3 times daily.  Massage cream into incision for one minute with each application.  Use sunscreen (50 SPF or higher) for first 6 months after surgery if area is exposed to sun.  You may alternate Mederma or other scar reducing cream with cocoa butter cream if desired.       Bailyn Spackman M. Acasia Skilton, MD, FACS      [facility]      Office: [phone]    

## 2014-02-16 ENCOUNTER — Other Ambulatory Visit: Payer: Self-pay

## 2014-02-16 DIAGNOSIS — Z1231 Encounter for screening mammogram for malignant neoplasm of breast: Secondary | ICD-10-CM

## 2014-02-19 ENCOUNTER — Telehealth (INDEPENDENT_AMBULATORY_CARE_PROVIDER_SITE_OTHER): Payer: Self-pay

## 2014-02-19 NOTE — Telephone Encounter (Signed)
Patient should have follow up office visit with lab work at end of July.  Will check wound then unless it progresses in the interim.  tmg  Earnstine Regal, MD, [facility] Office: [phone]

## 2014-02-19 NOTE — Telephone Encounter (Signed)
Pt advised of Dr Gala Lewandowsky recommendation and pt will call if area changes. Pt will keep next ov and have labs drawn.

## 2014-02-19 NOTE — Telephone Encounter (Signed)
Pt s/p parathyroidectomy on 01/11/14. Pt states that she had noticed a sm knot that has came up on the incision. Pt states that this area is less than a dime size. Pt states that she has noticed this area over the last couple weeks and it has not gotten any larger. Pt denies any redness, or drainage, no fevers, chills or n/v. Pt states that at times area can become a little tender. Informed pt to check a eye on the area for increased swelling or redness.  Advised pt that I would get with Dr Harlow Asa for further recommendations. Pt verbalized understanding and agrees with POC.

## 2014-02-21 ENCOUNTER — Ambulatory Visit: Payer: BC Managed Care – PPO

## 2014-02-28 ENCOUNTER — Ambulatory Visit: Payer: BC Managed Care – PPO

## 2014-03-23 LAB — PTH, INTACT AND CALCIUM
Calcium: 9.4 mg/dL (ref 8.4–10.5)
PTH: 62.1 pg/mL (ref 14.0–72.0)

## 2014-03-26 ENCOUNTER — Ambulatory Visit (INDEPENDENT_AMBULATORY_CARE_PROVIDER_SITE_OTHER): Payer: BC Managed Care – PPO | Admitting: Surgery

## 2014-03-26 ENCOUNTER — Encounter (INDEPENDENT_AMBULATORY_CARE_PROVIDER_SITE_OTHER): Payer: Self-pay | Admitting: Surgery

## 2014-03-26 VITALS — BP 100/78 | HR 88 | Temp 97.9°F | Resp 18 | Ht 64.0 in | Wt 154.0 lb

## 2014-03-26 DIAGNOSIS — E210 Primary hyperparathyroidism: Secondary | ICD-10-CM

## 2014-03-26 NOTE — Progress Notes (Signed)
General Surgery [facility]  Chief Complaint  Patient presents with  . Routine Post Op    parathyroidectomy 01/11/2014    HISTORY: Patient is a 43 year old female who underwent minimally invasive parathyroidectomy. Postoperative course has been relatively uneventful. Calcium level is now normal at 9.4. Intact PTH level is normal at 62.1.  EXAM: Surgical incision is healing nicely. Mild tenderness. No sign of infection. No sign of seroma. Voice quality is normal.  IMPRESSION: Status post minimally invasive parathyroidectomy  PLAN: Patient will continue to apply topical creams to her incision.  Patient should have a serum calcium level checked once per year.  Patient will return for surgical care as needed.  Earnstine Regal, MD, [facility]   Visit Diagnoses: 1. Hyperparathyroidism, primary

## 2014-03-26 NOTE — Patient Instructions (Signed)
You may try Scar-Away strips on your incision if desired.  Earnstine Regal, MD, [facility] Office: [phone]

## 2014-04-05 ENCOUNTER — Emergency Department (HOSPITAL_BASED_OUTPATIENT_CLINIC_OR_DEPARTMENT_OTHER): Payer: BC Managed Care – PPO

## 2014-04-05 ENCOUNTER — Emergency Department (HOSPITAL_BASED_OUTPATIENT_CLINIC_OR_DEPARTMENT_OTHER)
Admission: EM | Admit: 2014-04-05 | Discharge: 2014-04-05 | Disposition: A | Discharge status: Home / Self Care | Payer: BC Managed Care – PPO | Attending: Emergency Medicine | Admitting: Emergency Medicine

## 2014-04-05 ENCOUNTER — Encounter (HOSPITAL_BASED_OUTPATIENT_CLINIC_OR_DEPARTMENT_OTHER): Payer: Self-pay | Admitting: Emergency Medicine

## 2014-04-05 DIAGNOSIS — Z791 Long term (current) use of non-steroidal anti-inflammatories (NSAID): Secondary | ICD-10-CM | POA: Diagnosis not present

## 2014-04-05 DIAGNOSIS — Z8619 Personal history of other infectious and parasitic diseases: Secondary | ICD-10-CM | POA: Insufficient documentation

## 2014-04-05 DIAGNOSIS — K219 Gastro-esophageal reflux disease without esophagitis: Secondary | ICD-10-CM | POA: Diagnosis not present

## 2014-04-05 DIAGNOSIS — F17200 Nicotine dependence, unspecified, uncomplicated: Secondary | ICD-10-CM | POA: Insufficient documentation

## 2014-04-05 DIAGNOSIS — R079 Chest pain, unspecified: Secondary | ICD-10-CM | POA: Insufficient documentation

## 2014-04-05 DIAGNOSIS — K1370 Unspecified lesions of oral mucosa: Secondary | ICD-10-CM | POA: Insufficient documentation

## 2014-04-05 DIAGNOSIS — Z79899 Other long term (current) drug therapy: Secondary | ICD-10-CM | POA: Diagnosis not present

## 2014-04-05 DIAGNOSIS — Z8679 Personal history of other diseases of the circulatory system: Secondary | ICD-10-CM | POA: Insufficient documentation

## 2014-04-05 DIAGNOSIS — R209 Unspecified disturbances of skin sensation: Secondary | ICD-10-CM | POA: Insufficient documentation

## 2014-04-05 DIAGNOSIS — F341 Dysthymic disorder: Secondary | ICD-10-CM | POA: Diagnosis not present

## 2014-04-05 DIAGNOSIS — R0789 Other chest pain: Secondary | ICD-10-CM

## 2014-04-05 LAB — CBC WITH DIFFERENTIAL/PLATELET
Basophils Absolute: 0 10*3/uL (ref 0.0–0.1)
Basophils Relative: 0 % (ref 0–1)
EOS PCT: 4 % (ref 0–5)
Eosinophils Absolute: 0.3 10*3/uL (ref 0.0–0.7)
HEMATOCRIT: 35.5 % — AB (ref 36.0–46.0)
Hemoglobin: 11.4 g/dL — ABNORMAL LOW (ref 12.0–15.0)
LYMPHS ABS: 2.9 10*3/uL (ref 0.7–4.0)
Lymphocytes Relative: 38 % (ref 12–46)
MCH: 22.3 pg — AB (ref 26.0–34.0)
MCHC: 32.1 g/dL (ref 30.0–36.0)
MCV: 69.5 fL — ABNORMAL LOW (ref 78.0–100.0)
MONO ABS: 0.6 10*3/uL (ref 0.1–1.0)
MONOS PCT: 8 % (ref 3–12)
NEUTROS ABS: 3.9 10*3/uL (ref 1.7–7.7)
Neutrophils Relative %: 50 % (ref 43–77)
PLATELETS: 289 10*3/uL (ref 150–400)
RBC: 5.11 MIL/uL (ref 3.87–5.11)
RDW: 15.7 % — ABNORMAL HIGH (ref 11.5–15.5)
WBC: 7.7 10*3/uL (ref 4.0–10.5)

## 2014-04-05 LAB — BASIC METABOLIC PANEL
Anion gap: 10 (ref 5–15)
BUN: 11 mg/dL (ref 6–23)
CHLORIDE: 103 meq/L (ref 96–112)
CO2: 27 mEq/L (ref 19–32)
CREATININE: 0.9 mg/dL (ref 0.50–1.10)
Calcium: 9.7 mg/dL (ref 8.4–10.5)
GFR calc Af Amer: 89 mL/min — ABNORMAL LOW (ref >90)
GFR calc non Af Amer: 77 mL/min — ABNORMAL LOW (ref >90)
Glucose, Bld: 123 mg/dL — ABNORMAL HIGH (ref 70–99)
POTASSIUM: 4.1 meq/L (ref 3.7–5.3)
Sodium: 140 mEq/L (ref 137–147)

## 2014-04-05 LAB — TROPONIN I: Troponin I: <0.3 ng/mL (ref <0.30)

## 2014-04-05 MED ORDER — NAPROXEN 375 MG PO TABS: 375.0000 mg | ORAL_TABLET | Freq: Two times a day (BID) | ORAL | Status: AC

## 2014-04-05 NOTE — ED Notes (Signed)
Patient transported to X-ray 

## 2014-04-05 NOTE — ED Provider Notes (Signed)
CSN: [id number]     Arrival date & time 04/05/14  1105 History   First MD Initiated Contact with Patient 04/05/14 1124     Chief Complaint  Patient presents with  . Numbness     (Consider location/radiation/quality/duration/timing/severity/associated sxs/prior Treatment) HPI Comments: Patient presents with chest pain. She states it's been going on for several months. It's an aching in her left chest it radiates to her arm. She denies any associated symptoms. It's nonexertional and nonpleuritic. She states it generally occurs about 1-2 times a week. She doesn't notice anything that particularly brings it on. It's not related to eating. She denies any known history of heart problems. She denies any dizziness. She also has 2 sores in her mouth though been going on for the last few weeks. She saw her primary care physician was given a prescription for amoxicillin and a mouthwash. She didn't feel like they're improving at all. The are not really painful. They're not draining.   Past Medical History  Diagnosis Date  . History of chicken pox     childhood  . Depression with anxiety   . History of anorexia nervosa 1995  . History of headache   . GERD (gastroesophageal reflux disease)   . Allergic rhinitis   . History of migraines   . Chest pain     pt thinks due to ran out of prilosec   Past Surgical History  Procedure Laterality Date  . Cesarean section  88' & 92'  . Tubal ligation  1993  . Appendectomy  1980  . Parathyroidectomy Right 01/11/2014    Procedure: RIGHT INFERIOR PARATHYROIDECTOMY ;  Surgeon: Earnstine Regal, MD;  Location: [facility];  Service: General;  Laterality: Right;   Family History  Problem Relation Age of Onset  . Hypertension Mother   . Diabetes Mother   . Hypertension Maternal Grandmother    History  Substance Use Topics  . Smoking status: Current Every Day Smoker -- 0.75 packs/day for 20 years    Types: Cigarettes  . Smokeless tobacco: Never Used  . Alcohol Use:  Yes     Comment: occ   OB History   Grav Para Term Preterm Abortions TAB SAB Ect Mult Living                 Review of Systems  Constitutional: Negative for fever, chills, diaphoresis and fatigue.  HENT: Positive for mouth sores. Negative for congestion, rhinorrhea and sneezing.   Eyes: Negative.   Respiratory: Negative for cough, chest tightness and shortness of breath.   Cardiovascular: Positive for chest pain. Negative for leg swelling.  Gastrointestinal: Negative for nausea, vomiting, abdominal pain, diarrhea and blood in stool.  Genitourinary: Negative for frequency, hematuria, flank pain and difficulty urinating.  Musculoskeletal: Negative for arthralgias and back pain.  Skin: Negative for rash.  Neurological: Negative for dizziness, speech difficulty, weakness, numbness and headaches.      Allergies  Other; Bactrim; and Sulfa drugs cross reactors  Home Medications   Prior to Admission medications   Medication Sig Start Date End Date Taking? Authorizing Provider  acetaminophen (TYLENOL) 500 MG tablet Take 1,000 mg by mouth every 6 (six) hours as needed for mild pain.    Historical Provider, MD  baclofen (LIORESAL) 20 MG tablet Take 20 mg by mouth 3 (three) times daily as needed for muscle spasms.     Historical Provider, MD  buPROPion (WELLBUTRIN SR) 150 MG 12 hr tablet Take 150 mg by mouth every morning.  09/22/13   Historical Provider, MD  cetirizine (ZYRTEC) 10 MG tablet Take 10 mg by mouth daily.     Historical Provider, MD  Cholecalciferol (VITAMIN D) 2000 UNITS tablet Take 2,000 Units by mouth daily.    Historical Provider, MD  EPINEPHrine (EPIPEN IJ) Inject as directed as needed.    Historical Provider, MD  ibuprofen (ADVIL,MOTRIN) 200 MG tablet Take 400 mg by mouth every 6 (six) hours as needed for mild pain.    Historical Provider, MD  montelukast (SINGULAIR) 10 MG tablet Take 1 tablet (10 mg total) by mouth at bedtime. 08/06/11   Burnice Logan, MD  naproxen  (NAPROSYN) 375 MG tablet Take 1 tablet (375 mg total) by mouth 2 (two) times daily. 04/05/14   Malvin Johns, MD  naproxen sodium (ANAPROX) 220 MG tablet Take 220 mg by mouth 2 (two) times daily as needed (pain).    Historical Provider, MD  omeprazole (PRILOSEC) 20 MG capsule Take 20 mg by mouth 2 (two) times daily.     Historical Provider, MD   BP 117/83  Pulse 66  Temp(Src) 99 F (37.2 C) (Oral)  Resp 16  Ht 5\' 4"  (1.626 m)  Wt 154 lb (69.854 kg)  BMI 26.42 kg/m2  SpO2 100%  LMP 03/22/2014 Physical Exam  Constitutional: She is oriented to person, place, and time. She appears well-developed and well-nourished.  HENT:  Head: Normocephalic and atraumatic.  Patient has a slightly raised area to the hard palate. There is no ulceration. No erythema. It's nontender. There's also is a slightly erythematous area to her left upper gum.  Eyes: Pupils are equal, round, and reactive to light.  Neck: Normal range of motion. Neck supple.  Cardiovascular: Normal rate, regular rhythm and normal heart sounds.   Pulmonary/Chest: Effort normal and breath sounds normal. No respiratory distress. She has no wheezes. She has no rales. She exhibits no tenderness.  Abdominal: Soft. Bowel sounds are normal. There is no tenderness. There is no rebound and no guarding.  Musculoskeletal: Normal range of motion. She exhibits no edema.  No calf tenderness  Lymphadenopathy:    She has no cervical adenopathy.  Neurological: She is alert and oriented to person, place, and time.  Skin: Skin is warm and dry. No rash noted.  Psychiatric: She has a normal mood and affect.    ED Course  Procedures (including critical care time) Labs Review Results for orders placed during the hospital encounter of 04/05/14  CBC WITH DIFFERENTIAL      Result Value Ref Range   WBC 7.7  4.0 - 10.5 K/uL   RBC 5.11  3.87 - 5.11 MIL/uL   Hemoglobin 11.4 (*) 12.0 - 15.0 g/dL   HCT 35.5 (*) 36.0 - 46.0 %   MCV 69.5 (*) 78.0 - 100.0 fL    MCH 22.3 (*) 26.0 - 34.0 pg   MCHC 32.1  30.0 - 36.0 g/dL   RDW 15.7 (*) 11.5 - 15.5 %   Platelets 289  150 - 400 K/uL   Neutrophils Relative % 50  43 - 77 %   Lymphocytes Relative 38  12 - 46 %   Monocytes Relative 8  3 - 12 %   Eosinophils Relative 4  0 - 5 %   Basophils Relative 0  0 - 1 %   Neutro Abs 3.9  1.7 - 7.7 K/uL   Lymphs Abs 2.9  0.7 - 4.0 K/uL   Monocytes Absolute 0.6  0.1 - 1.0 K/uL  Eosinophils Absolute 0.3  0.0 - 0.7 K/uL   Basophils Absolute 0.0  0.0 - 0.1 K/uL   RBC Morphology SPHEROCYTES     Smear Review LARGE PLATELETS PRESENT    BASIC METABOLIC PANEL      Result Value Ref Range   Sodium 140  137 - 147 mEq/L   Potassium 4.1  3.7 - 5.3 mEq/L   Chloride 103  96 - 112 mEq/L   CO2 27  19 - 32 mEq/L   Glucose, Bld 123 (*) 70 - 99 mg/dL   BUN 11  6 - 23 mg/dL   Creatinine, Ser 0.90  0.50 - 1.10 mg/dL   Calcium 9.7  8.4 - 10.5 mg/dL   GFR calc non Af Amer 77 (*) >90 mL/min   GFR calc Af Amer 89 (*) >90 mL/min   Anion gap 10  5 - 15  TROPONIN I      Result Value Ref Range   Troponin I <0.30  <0.30 ng/mL   Dg Chest 2 View  04/05/2014   CLINICAL DATA:  Chest pain  EXAM: CHEST  2 VIEW  COMPARISON:  01/16/2014  FINDINGS: The heart size and mediastinal contours are within normal limits. Both lungs are clear. No pleural effusion or pneumothorax. The visualized skeletal structures are unremarkable.  IMPRESSION: Normal chest radiographs.   Electronically Signed   By: Lajean Manes M.D.   On: 04/05/2014 12:19     Imaging Review Dg Chest 2 View  04/05/2014   CLINICAL DATA:  Chest pain  EXAM: CHEST  2 VIEW  COMPARISON:  01/16/2014  FINDINGS: The heart size and mediastinal contours are within normal limits. Both lungs are clear. No pleural effusion or pneumothorax. The visualized skeletal structures are unremarkable.  IMPRESSION: Normal chest radiographs.   Electronically Signed   By: Lajean Manes M.D.   On: 04/05/2014 12:19     EKG Interpretation   Date/Time:   Thursday April 05 2014 11:51:58 EDT Ventricular Rate:  85 PR Interval:  162 QRS Duration: 72 QT Interval:  364 QTC Calculation: 433 R Axis:   33 Text Interpretation:  Normal sinus rhythm Normal ECG since last tracing no  significant change Confirmed by Ardine Iacovelli  MD, Zahmir Lalla ([id number]) on 04/05/2014  1:04:18 PM      MDM   Final diagnoses:  Chest pain of uncertain etiology    Patient presents with chest pain. This pain has been going on for months. Her EKG did not show any ischemic changes. It's nonexertional. Her troponins negative. Her chest x-ray is clear. She has no suggestions of pulmonary embolus. I feel that this has a low likelihood of being related to coronary artery disease. However I discussed the options of following up with her primary care physician if the pain continues she may need a stress test. She has some small lesions in her mouth which appear unchanged the last few weeks. They are not consistent with ulcers. He developed infected. I advised her to followup with her primary care physician if they're still not improving as she finishes the previously prescribed medicines.    Malvin Johns, MD 04/05/14 [phone]

## 2014-04-05 NOTE — ED Notes (Signed)
Pt returned from xray

## 2014-04-05 NOTE — ED Notes (Signed)
Pt c/o numbness in her left chest and arm for several months. She sts that the ekg was normal except for some bradycardia. She then had parathyhyroid surgery in June. Sts she has felt worse since the surgery. Pt also c/o sores in her mouth and recently found a sore on the side of her head.

## 2014-04-05 NOTE — Discharge Instructions (Signed)

## 2014-04-05 NOTE — ED Notes (Signed)
MD at bedside. 

## 2016-02-13 ENCOUNTER — Emergency Department (HOSPITAL_BASED_OUTPATIENT_CLINIC_OR_DEPARTMENT_OTHER)
Admission: EM | Admit: 2016-02-13 | Discharge: 2016-02-13 | Disposition: A | Discharge status: Home / Self Care | Payer: Self-pay | Attending: Emergency Medicine | Admitting: Emergency Medicine

## 2016-02-13 ENCOUNTER — Emergency Department (HOSPITAL_BASED_OUTPATIENT_CLINIC_OR_DEPARTMENT_OTHER): Payer: Self-pay

## 2016-02-13 ENCOUNTER — Encounter (HOSPITAL_BASED_OUTPATIENT_CLINIC_OR_DEPARTMENT_OTHER): Payer: Self-pay | Admitting: *Deleted

## 2016-02-13 DIAGNOSIS — R1030 Lower abdominal pain, unspecified: Secondary | ICD-10-CM

## 2016-02-13 DIAGNOSIS — R109 Unspecified abdominal pain: Secondary | ICD-10-CM | POA: Insufficient documentation

## 2016-02-13 DIAGNOSIS — Z87891 Personal history of nicotine dependence: Secondary | ICD-10-CM | POA: Insufficient documentation

## 2016-02-13 DIAGNOSIS — F329 Major depressive disorder, single episode, unspecified: Secondary | ICD-10-CM | POA: Insufficient documentation

## 2016-02-13 DIAGNOSIS — Z79899 Other long term (current) drug therapy: Secondary | ICD-10-CM | POA: Insufficient documentation

## 2016-02-13 LAB — COMPREHENSIVE METABOLIC PANEL
ALK PHOS: 60 U/L (ref 38–126)
ALT: 16 U/L (ref 14–54)
ANION GAP: 7 (ref 5–15)
AST: 21 U/L (ref 15–41)
Albumin: 3.8 g/dL (ref 3.5–5.0)
BUN: 12 mg/dL (ref 6–20)
CHLORIDE: 105 mmol/L (ref 101–111)
CO2: 26 mmol/L (ref 22–32)
Calcium: 8.7 mg/dL — ABNORMAL LOW (ref 8.9–10.3)
Creatinine, Ser: 0.85 mg/dL (ref 0.44–1.00)
GFR calc non Af Amer: >60 mL/min (ref >60)
Glucose, Bld: 89 mg/dL (ref 65–99)
Potassium: 3.4 mmol/L — ABNORMAL LOW (ref 3.5–5.1)
SODIUM: 138 mmol/L (ref 135–145)
Total Bilirubin: 0.3 mg/dL (ref 0.3–1.2)
Total Protein: 7.2 g/dL (ref 6.5–8.1)

## 2016-02-13 LAB — URINALYSIS, ROUTINE W REFLEX MICROSCOPIC
BILIRUBIN URINE: NEGATIVE
Glucose, UA: NEGATIVE mg/dL
HGB URINE DIPSTICK: NEGATIVE
Ketones, ur: NEGATIVE mg/dL
Leukocytes, UA: NEGATIVE
NITRITE: NEGATIVE
Protein, ur: NEGATIVE mg/dL
SPECIFIC GRAVITY, URINE: 1.028 (ref 1.005–1.030)
pH: 5.5 (ref 5.0–8.0)

## 2016-02-13 LAB — CBC WITH DIFFERENTIAL/PLATELET
BASOS PCT: 0 %
Basophils Absolute: 0 10*3/uL (ref 0.0–0.1)
EOS PCT: 3 %
Eosinophils Absolute: 0.2 10*3/uL (ref 0.0–0.7)
HEMATOCRIT: 32.1 % — AB (ref 36.0–46.0)
HEMOGLOBIN: 10.4 g/dL — AB (ref 12.0–15.0)
LYMPHS PCT: 36 %
Lymphs Abs: 3 10*3/uL (ref 0.7–4.0)
MCH: 21.4 pg — ABNORMAL LOW (ref 26.0–34.0)
MCHC: 32.4 g/dL (ref 30.0–36.0)
MCV: 65.9 fL — AB (ref 78.0–100.0)
MONOS PCT: 7 %
Monocytes Absolute: 0.6 10*3/uL (ref 0.1–1.0)
NEUTROS PCT: 54 %
Neutro Abs: 4.4 10*3/uL (ref 1.7–7.7)
Platelets: 285 10*3/uL (ref 150–400)
RBC: 4.87 MIL/uL (ref 3.87–5.11)
RDW: 17.2 % — ABNORMAL HIGH (ref 11.5–15.5)
WBC: 8.2 10*3/uL (ref 4.0–10.5)

## 2016-02-13 LAB — PREGNANCY, URINE: Preg Test, Ur: NEGATIVE

## 2016-02-13 LAB — LIPASE, BLOOD: Lipase: 16 U/L (ref 11–51)

## 2016-02-13 MED ORDER — PROMETHAZINE HCL 25 MG PO TABS: 25.0000 mg | ORAL_TABLET | Freq: Four times a day (QID) | ORAL | Status: AC | PRN

## 2016-02-13 MED ORDER — TRAMADOL HCL 50 MG PO TABS: 50.0000 mg | ORAL_TABLET | Freq: Four times a day (QID) | ORAL | Status: AC | PRN

## 2016-02-13 MED ORDER — IOPAMIDOL (ISOVUE-300) INJECTION 61%
100.0000 mL | Freq: Once | INTRAVENOUS | Status: AC | PRN
  Administered 2016-02-13: 100 mL via INTRAVENOUS

## 2016-02-13 MED ORDER — SODIUM CHLORIDE 0.9 % IV SOLN
INTRAVENOUS | Status: DC
  Administered 2016-02-13: 18:00:00 via INTRAVENOUS

## 2016-02-13 MED ORDER — ONDANSETRON HCL 4 MG/2ML IJ SOLN
4.0000 mg | Freq: Once | INTRAMUSCULAR | Status: AC
  Administered 2016-02-13: 4 mg via INTRAVENOUS
  Filled 2016-02-13: qty 2

## 2016-02-13 MED ORDER — SODIUM CHLORIDE 0.9 % IV BOLUS (SEPSIS)
1000.0000 mL | Freq: Once | INTRAVENOUS | Status: AC
  Administered 2016-02-13: 1000 mL via INTRAVENOUS

## 2016-02-13 NOTE — Discharge Instructions (Signed)
Workup for the abdominal pain without any acute findings. Would recommend follow-up with your regular doctor and consideration for colonoscopy. Take the tramadol as needed for pain. Take Phenergan as needed for the nausea and vomiting. Return for any new or worse symptoms.

## 2016-02-13 NOTE — ED Notes (Signed)
Pt c/o lower abd pain and rectal pressure x 3 weeks

## 2016-02-13 NOTE — ED Provider Notes (Signed)
CSN: [id number]     Arrival date & time 02/13/16  1729 History   By signing my name below, I, Maud Deed. Royston Sinner, attest that this documentation has been prepared under the direction and in the presence of Fredia Sorrow, MD.  Electronically Signed: Maud Deed. Royston Sinner, ED Scribe. 02/13/2016. 7:48 PM.   Chief Complaint  Patient presents with  . Abdominal Pain   The history is provided by the patient. No language interpreter was used.  45 year old female with three-week history of lower quadrant abdominal pain. Associated with nausea but no vomiting. Also associated with a rectal pressure feeling. No diarrhea.  HPI Comments: Amy Bishop is a 45 y.o. female  PCP: Pcp Not In System    Past Medical History  Diagnosis Date  . History of chicken pox     childhood  . Depression with anxiety   . History of anorexia nervosa 1995  . History of headache   . GERD (gastroesophageal reflux disease)   . Allergic rhinitis   . History of migraines   . Chest pain     pt thinks due to ran out of prilosec   Past Surgical History  Procedure Laterality Date  . Cesarean section  88' & 92'  . Tubal ligation  1993  . Appendectomy  1980  . Parathyroidectomy Right 01/11/2014    Procedure: RIGHT INFERIOR PARATHYROIDECTOMY ;  Surgeon: Earnstine Regal, MD;  Location: [facility];  Service: General;  Laterality: Right;   Family History  Problem Relation Age of Onset  . Hypertension Mother   . Diabetes Mother   . Hypertension Maternal Grandmother    Social History  Substance Use Topics  . Smoking status: Former Smoker -- 0.75 packs/day for 20 years    Types: Cigarettes  . Smokeless tobacco: Never Used  . Alcohol Use: Yes     Comment: occ   OB History    No data available     Review of Systems  Constitutional: Negative for fever and chills.  HENT: Negative for congestion, rhinorrhea and sore throat.   Eyes: Negative for visual disturbance.  Respiratory: Negative for cough and shortness of breath.    Cardiovascular: Negative for chest pain and leg swelling.  Gastrointestinal: Positive for nausea and abdominal pain. Negative for vomiting and diarrhea.  Genitourinary: Negative for dysuria.  Musculoskeletal: Negative for back pain and neck pain.  Skin: Negative for rash.  Neurological: Negative for dizziness, light-headedness and headaches.  Hematological: Does not bruise/bleed easily.  Psychiatric/Behavioral: Negative for confusion.      Allergies  Other; Bactrim; and Sulfa drugs cross reactors  Home Medications   Prior to Admission medications   Medication Sig Start Date End Date Taking? Authorizing Provider  acetaminophen (TYLENOL) 500 MG tablet Take 1,000 mg by mouth every 6 (six) hours as needed for mild pain.   Yes Historical Provider, MD  buPROPion (WELLBUTRIN SR) 150 MG 12 hr tablet Take 150 mg by mouth every morning.  09/22/13   Historical Provider, MD  Cholecalciferol (VITAMIN D) 2000 UNITS tablet Take 2,000 Units by mouth daily.    Historical Provider, MD  ibuprofen (ADVIL,MOTRIN) 200 MG tablet Take 400 mg by mouth every 6 (six) hours as needed for mild pain.    Historical Provider, MD  naproxen (NAPROSYN) 375 MG tablet Take 1 tablet (375 mg total) by mouth 2 (two) times daily. 04/05/14   Malvin Johns, MD  naproxen sodium (ANAPROX) 220 MG tablet Take 220 mg by mouth 2 (two) times daily  as needed (pain).    Historical Provider, MD  omeprazole (PRILOSEC) 20 MG capsule Take 20 mg by mouth 2 (two) times daily.     Historical Provider, MD  promethazine (PHENERGAN) 25 MG tablet Take 1 tablet (25 mg total) by mouth every 6 (six) hours as needed for nausea or vomiting. 02/13/16   Fredia Sorrow, MD  traMADol (ULTRAM) 50 MG tablet Take 1 tablet (50 mg total) by mouth every 6 (six) hours as needed. 02/13/16   Fredia Sorrow, MD   Triage Vitals: BP 146/90 mmHg  Pulse 60  Temp(Src) 99 F (37.2 C) (Oral)  Resp 18  Ht 5\' 4"  (1.626 m)  Wt 77.111 kg  BMI 29.17 kg/m2  SpO2 98%  LMP  02/09/2016   Physical Exam  Constitutional: She is oriented to person, place, and time. She appears well-developed and well-nourished. No distress.  HENT:  Head: Normocephalic and atraumatic.  Mouth/Throat: Oropharynx is clear and moist.  Eyes: Conjunctivae and EOM are normal. Pupils are equal, round, and reactive to light.  Neck: Neck supple.  Cardiovascular: Normal rate and regular rhythm.   Pulmonary/Chest: Effort normal and breath sounds normal. No respiratory distress.  Abdominal: Soft. Bowel sounds are normal. There is no tenderness.  Musculoskeletal: Normal range of motion.  Neurological: She is alert and oriented to person, place, and time. No cranial nerve deficit. She exhibits normal muscle tone. Coordination normal.  Skin: Skin is warm.  Nursing note and vitals reviewed.   ED Course  Procedures (including critical care time)  DIAGNOSTIC STUDIES: Oxygen Saturation is 98% on RA, Normal by my interpretation.    COORDINATION OF CARE: 7:48 PM-Discussed treatment plan with pt at bedside and pt agreed to plan.     Labs Review Labs Reviewed  URINALYSIS, ROUTINE W REFLEX MICROSCOPIC (NOT AT [facility]) - Abnormal; Notable for the following:    APPearance CLOUDY (*)    All other components within normal limits  COMPREHENSIVE METABOLIC PANEL - Abnormal; Notable for the following:    Potassium 3.4 (*)    Calcium 8.7 (*)    All other components within normal limits  CBC WITH DIFFERENTIAL/PLATELET - Abnormal; Notable for the following:    Hemoglobin 10.4 (*)    HCT 32.1 (*)    MCV 65.9 (*)    MCH 21.4 (*)    RDW 17.2 (*)    All other components within normal limits  PREGNANCY, URINE  LIPASE, BLOOD   Results for orders placed or performed during the hospital encounter of 02/13/16  Pregnancy, urine  Result Value Ref Range   Preg Test, Ur NEGATIVE NEGATIVE  Urinalysis, Routine w reflex microscopic (not at [facility])  Result Value Ref Range   Color, Urine YELLOW YELLOW   APPearance  CLOUDY (A) CLEAR   Specific Gravity, Urine 1.028 1.005 - 1.030   pH 5.5 5.0 - 8.0   Glucose, UA NEGATIVE NEGATIVE mg/dL   Hgb urine dipstick NEGATIVE NEGATIVE   Bilirubin Urine NEGATIVE NEGATIVE   Ketones, ur NEGATIVE NEGATIVE mg/dL   Protein, ur NEGATIVE NEGATIVE mg/dL   Nitrite NEGATIVE NEGATIVE   Leukocytes, UA NEGATIVE NEGATIVE  Comprehensive metabolic panel  Result Value Ref Range   Sodium 138 135 - 145 mmol/L   Potassium 3.4 (L) 3.5 - 5.1 mmol/L   Chloride 105 101 - 111 mmol/L   CO2 26 22 - 32 mmol/L   Glucose, Bld 89 65 - 99 mg/dL   BUN 12 6 - 20 mg/dL   Creatinine, Ser 0.85 0.44 -  1.00 mg/dL   Calcium 8.7 (L) 8.9 - 10.3 mg/dL   Total Protein 7.2 6.5 - 8.1 g/dL   Albumin 3.8 3.5 - 5.0 g/dL   AST 21 15 - 41 U/L   ALT 16 14 - 54 U/L   Alkaline Phosphatase 60 38 - 126 U/L   Total Bilirubin 0.3 0.3 - 1.2 mg/dL   GFR calc non Af Amer >60 >60 mL/min   GFR calc Af Amer >60 >60 mL/min   Anion gap 7 5 - 15  Lipase, blood  Result Value Ref Range   Lipase 16 11 - 51 U/L  CBC with Differential/Platelet  Result Value Ref Range   WBC 8.2 4.0 - 10.5 K/uL   RBC 4.87 3.87 - 5.11 MIL/uL   Hemoglobin 10.4 (L) 12.0 - 15.0 g/dL   HCT 32.1 (L) 36.0 - 46.0 %   MCV 65.9 (L) 78.0 - 100.0 fL   MCH 21.4 (L) 26.0 - 34.0 pg   MCHC 32.4 30.0 - 36.0 g/dL   RDW 17.2 (H) 11.5 - 15.5 %   Platelets 285 150 - 400 K/uL   Neutrophils Relative % 54 %   Lymphocytes Relative 36 %   Monocytes Relative 7 %   Eosinophils Relative 3 %   Basophils Relative 0 %   Neutro Abs 4.4 1.7 - 7.7 K/uL   Lymphs Abs 3.0 0.7 - 4.0 K/uL   Monocytes Absolute 0.6 0.1 - 1.0 K/uL   Eosinophils Absolute 0.2 0.0 - 0.7 K/uL   Basophils Absolute 0.0 0.0 - 0.1 K/uL   RBC Morphology POLYCHROMASIA PRESENT    Smear Review LARGE PLATELETS PRESENT      Imaging Review Ct Abdomen Pelvis W Contrast  02/13/2016  CLINICAL DATA:  Lower abdominal pain and rectal pressure, 3 weeks duration. EXAM: CT ABDOMEN AND PELVIS WITH CONTRAST  TECHNIQUE: Multidetector CT imaging of the abdomen and pelvis was performed using the standard protocol following bolus administration of intravenous contrast. CONTRAST:  152mL ISOVUE-300 IOPAMIDOL (ISOVUE-300) INJECTION 61% COMPARISON:  Pelvic ultrasound 01/21/2010 FINDINGS: Lung bases are clear. No pleural or parenchymal fluid. Asymmetric glandular tissue of the breasts, nonspecific. Is the breast examination and mammogram history normal? The liver has a normal appearance. No calcified gallstones. The spleen is normal. The pancreas is normal. The adrenal glands are normal. The kidneys are normal. No cyst, mass, stone or hydronephrosis. The aorta and IVC are normal. No retroperitoneal mass or adenopathy. No free intraperitoneal fluid or air. No visible bowel pathology. Uterus and adnexal regions are normal. No abnormal bone finding. IMPRESSION: Negative CT scan of the abdomen and pelvis. No evidence of bowel pathology or other cause of lower abdominal pain or rectal pressure. Asymmetric glandular tissue in the breasts. This can be normal. Is the physical examination and mammogram history normal? Electronically Signed   By: Nelson Chimes M.D.   On: 02/13/2016 19:13   I have personally reviewed and evaluated these images and lab results as part of my medical decision-making.   EKG Interpretation None      MDM   Final diagnoses:  Lower abdominal pain    Patient with three-week complaint of lower abdominal pain. With some rectal pressure. Workup here today without any acute findings. No leukocytosis no liver function test abnormalities. Patient most likely needs outpatient colonoscopy. Will treat symptomatically have her follow-up with her regular doctor.    I personally performed the services described in this documentation, which was scribed in my presence. The recorded information has been  reviewed and is accurate.     Fredia Sorrow, MD 02/13/16 1950

## 2016-03-20 ENCOUNTER — Other Ambulatory Visit: Payer: Self-pay | Admitting: Family Medicine

## 2016-03-20 DIAGNOSIS — Z1231 Encounter for screening mammogram for malignant neoplasm of breast: Secondary | ICD-10-CM

## 2016-04-02 ENCOUNTER — Ambulatory Visit
Admission: RE | Admit: 2016-04-02 | Discharge: 2016-04-02 | Disposition: A | Discharge status: Home / Self Care | Payer: 59 | Source: Ambulatory Visit | Attending: Family Medicine | Admitting: Family Medicine

## 2016-04-02 DIAGNOSIS — Z1231 Encounter for screening mammogram for malignant neoplasm of breast: Secondary | ICD-10-CM

## 2018-01-19 IMAGING — CT CT ABD-PELV W/ CM
2 of 5 series · 17 of 46 positions shown, 19 images · IV contrast (APPLIED)
Comparison: Pelvic ultrasound 01/21/2010

CLINICAL DATA: Lower abdominal pain and rectal pressure, 3 weeks
duration.

EXAM:
CT ABDOMEN AND PELVIS WITH CONTRAST
TECHNIQUE: Multidetector CT imaging of the abdomen and pelvis was performed
using the standard protocol following bolus administration of
intravenous contrast.
CONTRAST:  100mL AGICR3-VNN IOPAMIDOL (AGICR3-VNN) INJECTION 61%

[Series 2: axial st · axial · 0.98mm/px · z∈[-508,-108]mm · 14 of 91 slices shown, 16 images]
[im 6/91  soft-tissue]
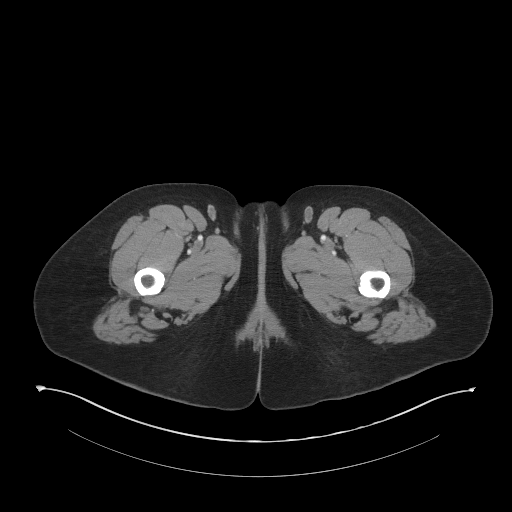
[im 6/91  bone]
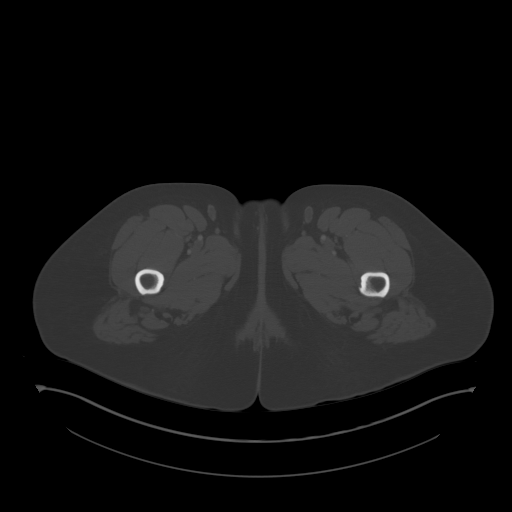
[im 11/91  soft-tissue]
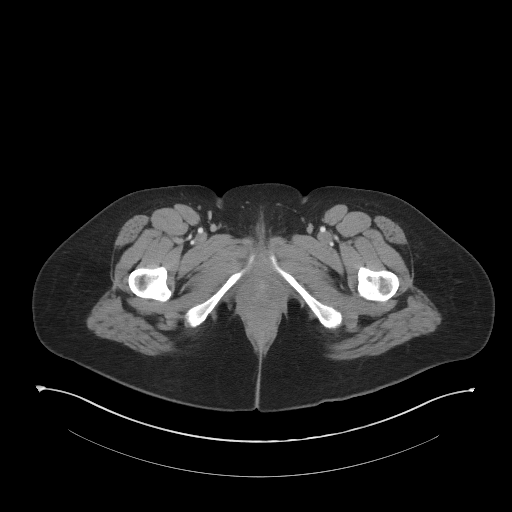
[im 21/91  soft-tissue]
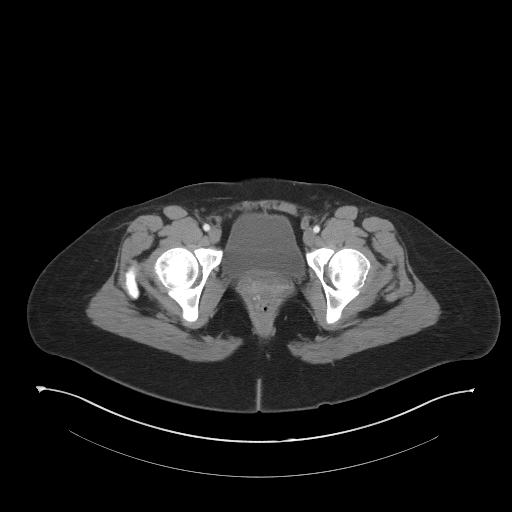
[im 26/91  soft-tissue]
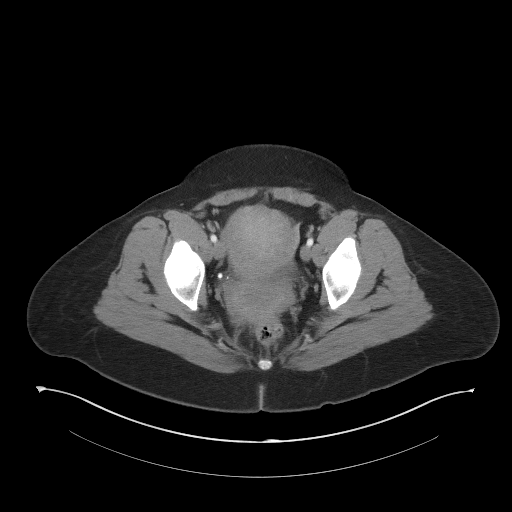
[im 31/91  soft-tissue]
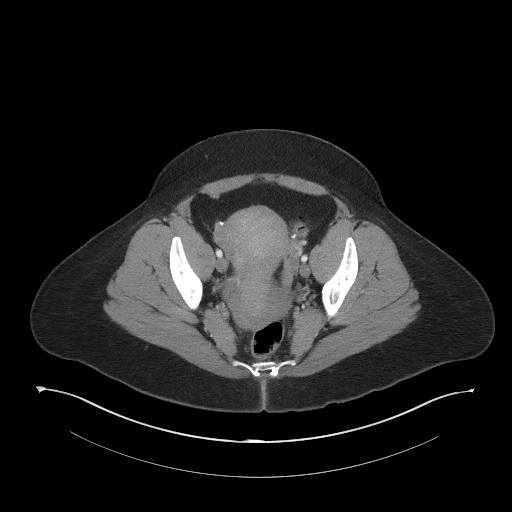
[im 36/91  soft-tissue]
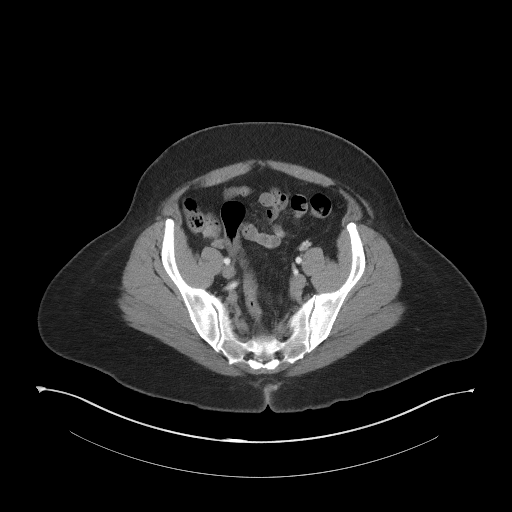
[im 41/91  soft-tissue]
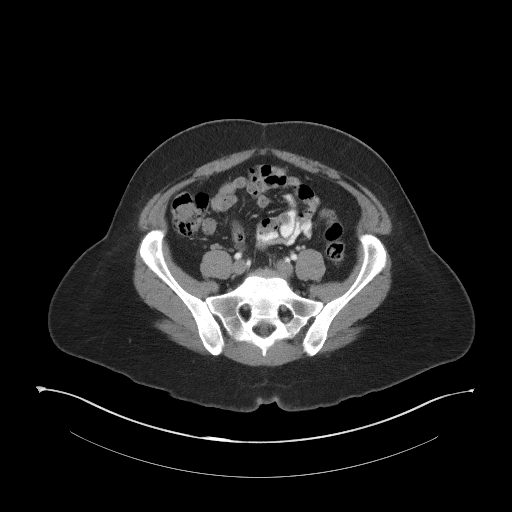
[im 51/91  soft-tissue]
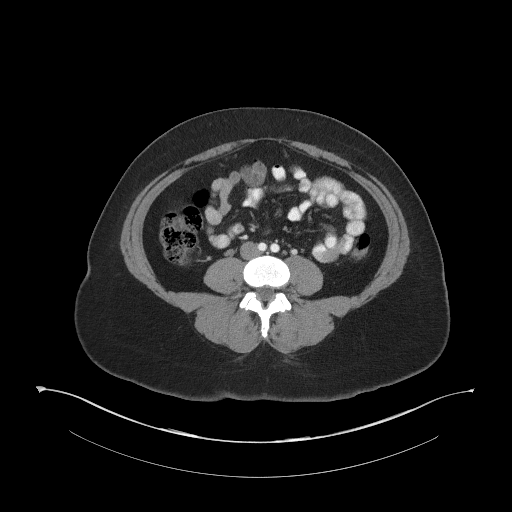
[im 56/91  soft-tissue]
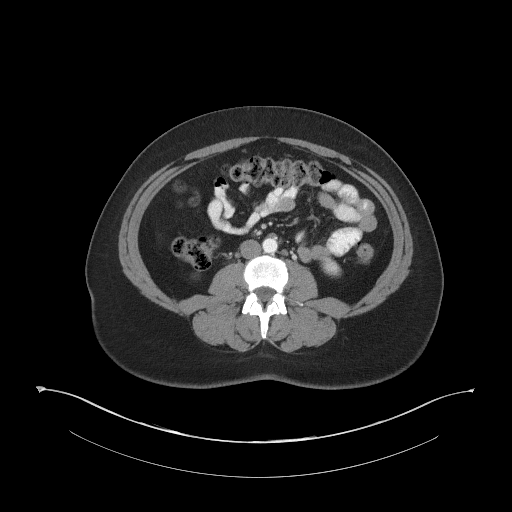
[im 56/91  bone]
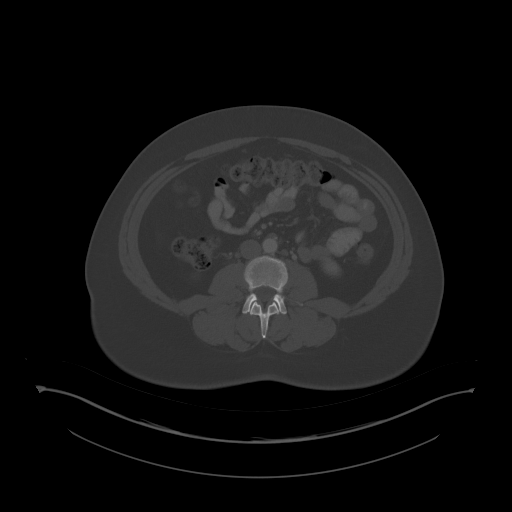
[im 61/91  soft-tissue]
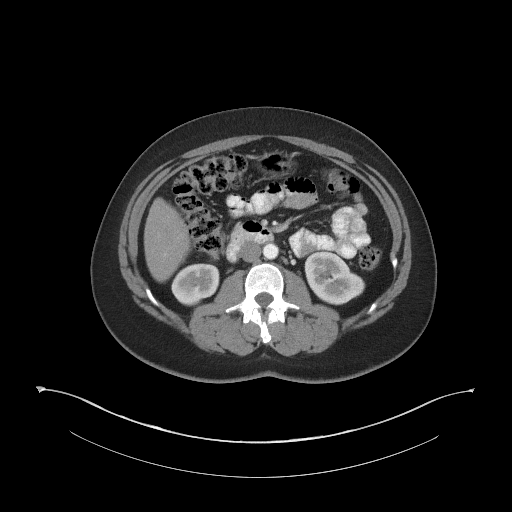
[im 66/91  soft-tissue]
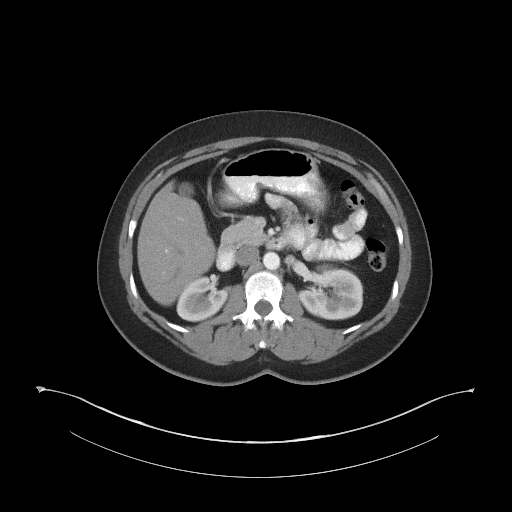
[im 71/91  soft-tissue]
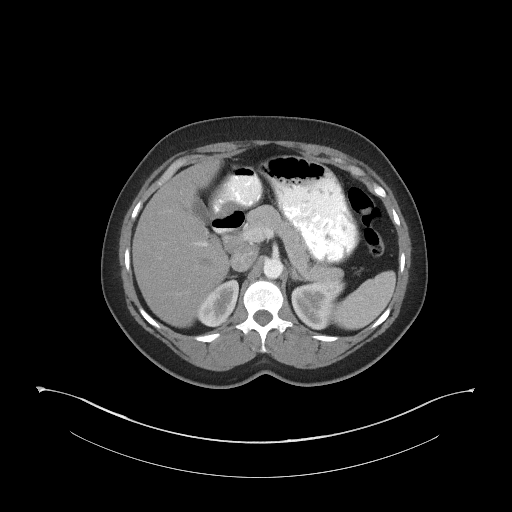
[im 81/91  soft-tissue]
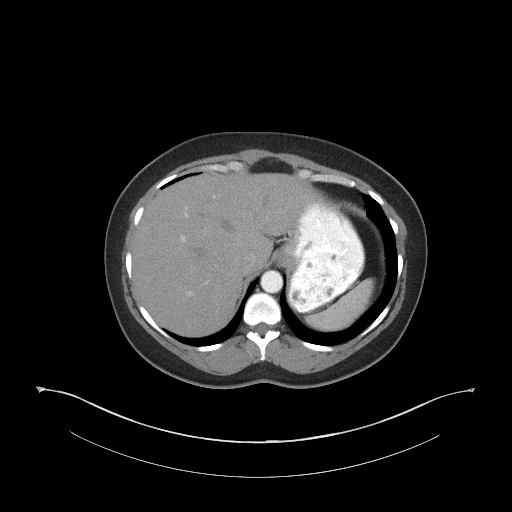
[im 86/91  soft-tissue]
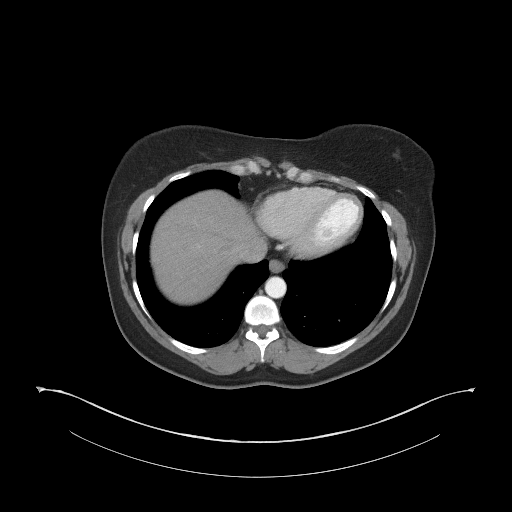

[Series 5: coronal st · coronal · 0.91mm/px · 3 of 89 slices shown]
[im 30/89  soft-tissue]
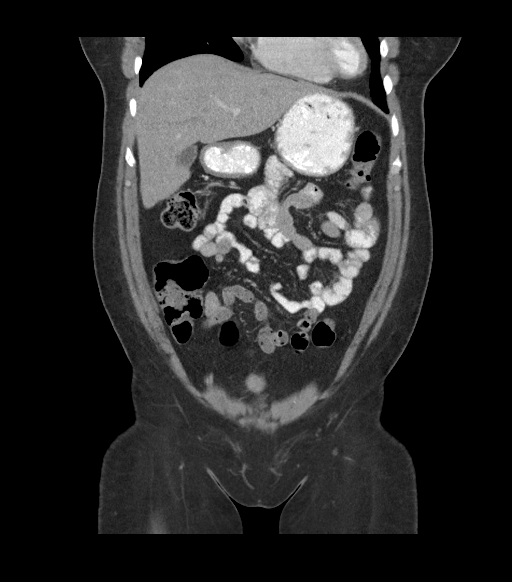
[im 40/89  soft-tissue]
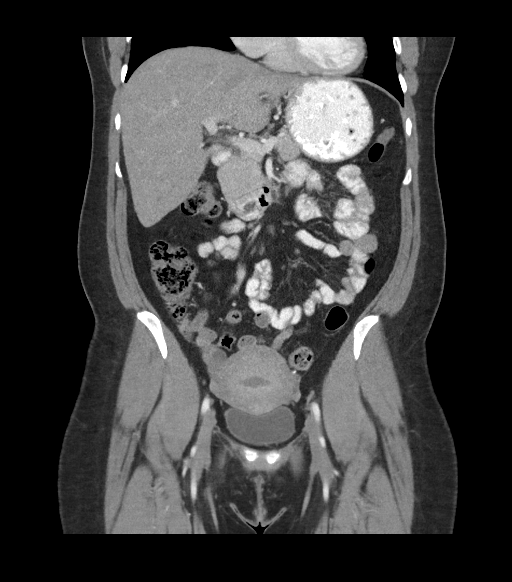
[im 49/89  soft-tissue]
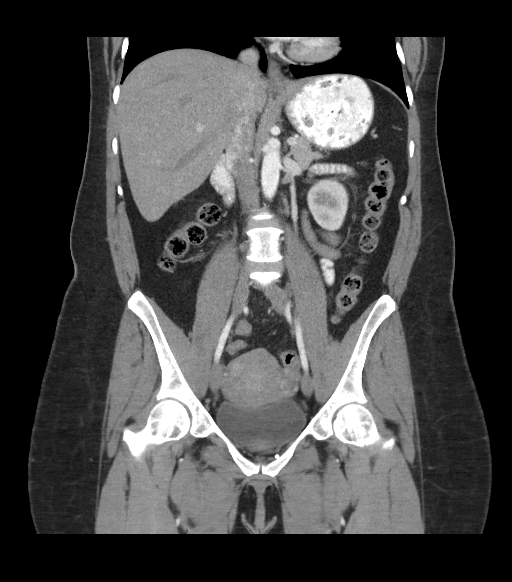

[17 of 46 positions shown; findings below may reference images not displayed]

FINDINGS: Lung bases are clear. No pleural or parenchymal fluid. Asymmetric
glandular tissue of the breasts, nonspecific. Is the breast
examination and mammogram history normal?

The liver has a normal appearance. No calcified gallstones. The
spleen is normal. The pancreas is normal. The adrenal glands are
normal. The kidneys are normal. No cyst, mass, stone or
hydronephrosis. The aorta and IVC are normal. No retroperitoneal
mass or adenopathy. No free intraperitoneal fluid or air. No visible
bowel pathology. Uterus and adnexal regions are normal. No abnormal
bone finding.
IMPRESSION: Negative CT scan of the abdomen and pelvis. No evidence of bowel
pathology or other cause of lower abdominal pain or rectal pressure.

Asymmetric glandular tissue in the breasts. This can be normal. Is
the physical examination and mammogram history normal?

## 2022-08-27 LAB — COLOGUARD: COLOGUARD: POSITIVE — AB

## 2023-12-31 ENCOUNTER — Other Ambulatory Visit: Payer: Self-pay | Admitting: Endocrinology

## 2023-12-31 DIAGNOSIS — E010 Iodine-deficiency related diffuse (endemic) goiter: Secondary | ICD-10-CM

## 2024-01-05 ENCOUNTER — Ambulatory Visit
Admission: RE | Admit: 2024-01-05 | Discharge: 2024-01-05 | Discharge status: Home / Self Care | Source: Ambulatory Visit | Attending: Endocrinology | Admitting: Endocrinology

## 2024-01-05 DIAGNOSIS — E010 Iodine-deficiency related diffuse (endemic) goiter: Secondary | ICD-10-CM

## 2024-03-06 ENCOUNTER — Other Ambulatory Visit: Payer: Self-pay

## 2024-03-06 ENCOUNTER — Encounter (HOSPITAL_BASED_OUTPATIENT_CLINIC_OR_DEPARTMENT_OTHER): Payer: Self-pay | Admitting: Emergency Medicine

## 2024-03-06 ENCOUNTER — Emergency Department (HOSPITAL_BASED_OUTPATIENT_CLINIC_OR_DEPARTMENT_OTHER)
Admission: EM | Admit: 2024-03-06 | Discharge: 2024-03-06 | Discharge status: Against Medical Advice | Attending: Emergency Medicine | Admitting: Emergency Medicine

## 2024-03-06 ENCOUNTER — Emergency Department (HOSPITAL_BASED_OUTPATIENT_CLINIC_OR_DEPARTMENT_OTHER)

## 2024-03-06 DIAGNOSIS — M549 Dorsalgia, unspecified: Secondary | ICD-10-CM | POA: Diagnosis not present

## 2024-03-06 DIAGNOSIS — M79602 Pain in left arm: Secondary | ICD-10-CM | POA: Insufficient documentation

## 2024-03-06 DIAGNOSIS — R079 Chest pain, unspecified: Secondary | ICD-10-CM | POA: Insufficient documentation

## 2024-03-06 DIAGNOSIS — R42 Dizziness and giddiness: Secondary | ICD-10-CM | POA: Diagnosis present

## 2024-03-06 DIAGNOSIS — R0602 Shortness of breath: Secondary | ICD-10-CM | POA: Diagnosis not present

## 2024-03-06 DIAGNOSIS — R519 Headache, unspecified: Secondary | ICD-10-CM | POA: Insufficient documentation

## 2024-03-06 DIAGNOSIS — Z5321 Procedure and treatment not carried out due to patient leaving prior to being seen by health care provider: Secondary | ICD-10-CM | POA: Diagnosis not present

## 2024-03-06 LAB — CBC
HCT: 44.5 % (ref 36.0–46.0)
Hemoglobin: 14 g/dL (ref 12.0–15.0)
MCH: 22.2 pg — ABNORMAL LOW (ref 26.0–34.0)
MCHC: 31.5 g/dL (ref 30.0–36.0)
MCV: 70.4 fL — ABNORMAL LOW (ref 80.0–100.0)
Platelets: 294 K/uL (ref 150–400)
RBC: 6.32 MIL/uL — ABNORMAL HIGH (ref 3.87–5.11)
RDW: 16.1 % — ABNORMAL HIGH (ref 11.5–15.5)
WBC: 11.9 K/uL — ABNORMAL HIGH (ref 4.0–10.5)
nRBC: 0 % (ref 0.0–0.2)

## 2024-03-06 LAB — BASIC METABOLIC PANEL WITH GFR
Anion gap: 11 (ref 5–15)
BUN: 9 mg/dL (ref 6–20)
CO2: 27 mmol/L (ref 22–32)
Calcium: 9.6 mg/dL (ref 8.9–10.3)
Chloride: 103 mmol/L (ref 98–111)
Creatinine, Ser: 0.83 mg/dL (ref 0.44–1.00)
GFR, Estimated: >60 mL/min (ref >60)
Glucose, Bld: 87 mg/dL (ref 70–99)
Potassium: 4.1 mmol/L (ref 3.5–5.1)
Sodium: 141 mmol/L (ref 135–145)

## 2024-03-06 LAB — TROPONIN T, HIGH SENSITIVITY: Troponin T High Sensitivity: <15 ng/L (ref <19)

## 2024-03-06 NOTE — ED Triage Notes (Signed)
 Pt reports dizziness last night w HA, CP and LT arm pain, states CP has continued today with ShoB, dizziness, and back pain

## 2024-09-14 ENCOUNTER — Emergency Department (HOSPITAL_BASED_OUTPATIENT_CLINIC_OR_DEPARTMENT_OTHER)
Admission: EM | Admit: 2024-09-14 | Discharge: 2024-09-15 | Disposition: A | Discharge status: Home / Self Care | Source: Home / Self Care | Attending: Emergency Medicine | Admitting: Emergency Medicine

## 2024-09-14 ENCOUNTER — Emergency Department (HOSPITAL_BASED_OUTPATIENT_CLINIC_OR_DEPARTMENT_OTHER)

## 2024-09-14 ENCOUNTER — Other Ambulatory Visit: Payer: Self-pay

## 2024-09-14 ENCOUNTER — Encounter (HOSPITAL_BASED_OUTPATIENT_CLINIC_OR_DEPARTMENT_OTHER): Payer: Self-pay

## 2024-09-14 DIAGNOSIS — N644 Mastodynia: Secondary | ICD-10-CM

## 2024-09-14 LAB — CBC WITH DIFFERENTIAL/PLATELET
Abs Immature Granulocytes: 0.03 10*3/uL (ref 0.00–0.07)
Basophils Absolute: 0.1 10*3/uL (ref 0.0–0.1)
Basophils Relative: 1 %
Eosinophils Absolute: 0.4 10*3/uL (ref 0.0–0.5)
Eosinophils Relative: 3 %
HCT: 44.5 % (ref 36.0–46.0)
Hemoglobin: 13.9 g/dL (ref 12.0–15.0)
Immature Granulocytes: 0 %
Lymphocytes Relative: 40 %
Lymphs Abs: 4.7 10*3/uL — ABNORMAL HIGH (ref 0.7–4.0)
MCH: 21.9 pg — ABNORMAL LOW (ref 26.0–34.0)
MCHC: 31.2 g/dL (ref 30.0–36.0)
MCV: 70.1 fL — ABNORMAL LOW (ref 80.0–100.0)
Monocytes Absolute: 0.9 10*3/uL (ref 0.1–1.0)
Monocytes Relative: 7 %
Neutro Abs: 5.7 10*3/uL (ref 1.7–7.7)
Neutrophils Relative %: 49 %
Platelets: 253 10*3/uL (ref 150–400)
RBC: 6.35 MIL/uL — ABNORMAL HIGH (ref 3.87–5.11)
RDW: 15.5 % (ref 11.5–15.5)
WBC: 11.7 10*3/uL — ABNORMAL HIGH (ref 4.0–10.5)
nRBC: 0 % (ref 0.0–0.2)

## 2024-09-14 LAB — BASIC METABOLIC PANEL WITH GFR
Anion gap: 10 (ref 5–15)
BUN: 9 mg/dL (ref 6–20)
CO2: 27 mmol/L (ref 22–32)
Calcium: 9.2 mg/dL (ref 8.9–10.3)
Chloride: 105 mmol/L (ref 98–111)
Creatinine, Ser: 0.75 mg/dL (ref 0.44–1.00)
GFR, Estimated: >60 mL/min
Glucose, Bld: 104 mg/dL — ABNORMAL HIGH (ref 70–99)
Potassium: 3.6 mmol/L (ref 3.5–5.1)
Sodium: 142 mmol/L (ref 135–145)

## 2024-09-14 MED ORDER — OXYCODONE-ACETAMINOPHEN 5-325 MG PO TABS
1.0000 | ORAL_TABLET | Freq: Once | ORAL | Status: AC
  Administered 2024-09-14: 1 via ORAL
  Filled 2024-09-14: qty 1

## 2024-09-14 MED ORDER — IOHEXOL 300 MG/ML  SOLN
75.0000 mL | Freq: Once | INTRAMUSCULAR | Status: AC | PRN
  Administered 2024-09-14: 75 mL via INTRAVENOUS

## 2024-09-14 NOTE — ED Triage Notes (Signed)
 Pt is coming in for left breat breast that she has had since her lumpectomy in November, she states the pain would come and go but this time the pain wont go away and it is worse than it has been. She states she pain can also be felt in her arm.

## 2024-09-14 NOTE — ED Provider Notes (Signed)
 " [facility] Provider Note   CSN: [id number] Arrival date & time: 09/14/24  2148     Patient presents with: Breast Pain   Amy Bishop is a 54 y.o. female with past medical history of GERD, hyperparathyroidism, status post left breast lumpectomy, who presents emergency department for evaluation of continued left breast pain and tenderness.  Patient reports that she had a lumpectomy on 06/20/2024 and has had pain and tenderness to the area since.  Patient reports that the pain is improved when she wears a sports bra and has increased compression, but is soon as she changes out of it, the pain returns.  Patient is also reporting pain in her shoulder blade as well as intermittent headaches.  Patient states that she has taken Tylenol  and ibuprofen but neither have worked and she has resorted to taking hydrocodone .  HPI     Prior to Admission medications  Medication Sig Start Date End Date Taking? Authorizing Provider  acetaminophen  (TYLENOL ) 500 MG tablet Take 1,000 mg by mouth every 6 (six) hours as needed for mild pain.    [provider]  buPROPion (WELLBUTRIN SR) 150 MG 12 hr tablet Take 150 mg by mouth every morning.  09/22/13   [provider]  Cholecalciferol (VITAMIN D) 2000 UNITS tablet Take 2,000 Units by mouth daily.    [provider]  ibuprofen (ADVIL,MOTRIN) 200 MG tablet Take 400 mg by mouth every 6 (six) hours as needed for mild pain.    [provider]  naproxen  (NAPROSYN ) 375 MG tablet Take 1 tablet (375 mg total) by mouth 2 (two) times daily. 04/05/14   Lenor Hollering, MD  naproxen  sodium (ANAPROX ) 220 MG tablet Take 220 mg by mouth 2 (two) times daily as needed (pain).    [provider]  omeprazole  (PRILOSEC ) 20 MG capsule Take 20 mg by mouth 2 (two) times daily.     [provider]  promethazine  (PHENERGAN ) 25 MG tablet Take 1 tablet (25 mg total) by mouth every 6 (six)  hours as needed for nausea or vomiting. 02/13/16   Zackowski, Scott, MD  traMADol  (ULTRAM ) 50 MG tablet Take 1 tablet (50 mg total) by mouth every 6 (six) hours as needed. 02/13/16   Zackowski, Scott, MD    Allergies: Other, Bactrim, and Sulfa drugs cross reactors    Review of Systems  Genitourinary:        Breast pain    Updated Vital Signs BP (!) 155/103 (BP Location: Right Arm)   Pulse 82   Temp 97.9 F (36.6 C)   Resp 18   LMP 02/09/2016   SpO2 100%   Physical Exam Vitals and nursing note reviewed.  Constitutional:      Appearance: Normal appearance. She is not ill-appearing.  Eyes:     General: No scleral icterus. Pulmonary:     Effort: Pulmonary effort is normal. No respiratory distress.  Chest:     Chest wall: Tenderness present.       Comments: Breast tenderness to the above area, with induration noted.  No fluctuance, erythema or pus.  There is a slightly 4 cm surgical scar below the nipple line.  It appears well-healed. Musculoskeletal:        General: No deformity.  Skin:    Coloration: Skin is not jaundiced.  Neurological:     General: No focal deficit present.     Mental Status: She is alert.  Psychiatric:  Mood and Affect: Mood normal.     (all labs ordered are listed, but only abnormal results are displayed) Labs Reviewed  CBC WITH DIFFERENTIAL/PLATELET - Abnormal; Notable for the following components:      Result Value   WBC 11.7 (*)    RBC 6.35 (*)    MCV 70.1 (*)    MCH 21.9 (*)    Lymphs Abs 4.7 (*)    All other components within normal limits  BASIC METABOLIC PANEL WITH GFR - Abnormal; Notable for the following components:   Glucose, Bld 104 (*)    All other components within normal limits    EKG: None  Radiology: CT Chest W Contrast Result Date: 09/14/2024 EXAM: CT CHEST WITH CONTRAST 09/14/2024 11:37:03 PM TECHNIQUE: CT of the chest was performed with the administration of intravenous contrast. 75 mL of iohexol  (OMNIPAQUE ) 300  MG/ML solution was administered. Multiplanar reformatted images are provided for review. Automated exposure control, iterative reconstruction, and/or weight based adjustment of the mA/kV was utilized to reduce the radiation dose to as low as reasonably achievable. COMPARISON: None available. CLINICAL HISTORY: Breast lump. FINDINGS: MEDIASTINUM: Heart and pericardium are unremarkable. The central airways are clear. LYMPH NODES: Small left axillary nodes measuring up to 6 mm short axis. No mediastinal or hilar lymphadenopathy. LUNGS AND PLEURA: Minimal bibasilar atelectasis. No focal consolidation or pulmonary edema. No pleural effusion or pneumothorax. SOFT TISSUES/BONES: Prominent soft tissue in the left breast may reflect asymmetric glandular tissue (images 37 and 54), although indeterminate. No acute abnormality of the bones. UPPER ABDOMEN: Limited images of the upper abdomen demonstrates no acute abnormality. IMPRESSION: 1. Prominent soft tissue in the left breast, indeterminate and possibly reflecting asymmetric glandular tissue, but warranting follow-up. Recommend diagnostic mammography. 2. Small left axillary lymph nodes measuring up to 6 mm short axis. Electronically signed by: Pinkie Pebbles MD 09/14/2024 11:48 PM EST RP Workstation: [id number]     Procedures   Medications Ordered in the ED  oxyCODONE -acetaminophen  (PERCOCET/ROXICET) 5-325 MG per tablet 1 tablet (1 tablet Oral Given 09/14/24 2245)  iohexol  (OMNIPAQUE ) 300 MG/ML solution 75 mL (75 mLs Intravenous Contrast Given 09/14/24 2332)                                 Medical Decision Making Amount and/or Complexity of Data Reviewed Labs: ordered. Radiology: ordered.  Risk Prescription drug management.   54 year old female presents emergency department for evaluation of left breast pain, that has worsened since a lumpectomy on 11/11.  Differential diagnosis: Abscess, surgical complication, cellulitis, strain, cyst, fat necrosis,  hematoma, mass recurrence.  Ultimately, I feel the patient would benefit from obtaining a breast ultrasound and a repeat mammogram, but neither of those test can be performed in the emergency department.  I did obtain screening labs including a CBC and BMP to rule out infection.  Labs show mild leukocytosis of 11.7.  Given this finding, I ordered a CT chest with contrast for further workup.  CT shows prominent soft tissue in the left breast, indeterminant and possibly asymmetric glandular tissue that warrants further workup via ultrasound.  I personally viewed and interpreted these images and agree with the radiologist interpretation..  I also gave the patient a Percocet for pain control, as she does appear to be in a significant amount of distress.  I we will send patient home with a referral to [facility], where she can obtained the above workup.  I  also recommended patient continue alternating between Tylenol  and ibuprofen every 6 hours as needed for the pain.  Given the nature of patient's complaint, and reassuring workup, I do not feel any additional testing is indicated in the emergency department.  Patient's vitals are stable.  Patient is appropriate for discharge at this time.   Final diagnoses:  Breast pain, left    ED Discharge Orders     None          Amy Bishop 09/14/24 2356    Amy Jerilynn GORMAN, MD 09/15/24 0005  "

## 2024-09-14 NOTE — Discharge Instructions (Addendum)
 Is a pleasure taking care of you today.  Your workup was reassuring.  Your CT scan does show prominent soft tissue in the left breast, which is indeterminate and possibly reflects a symmetrical annular tissue.  This does warrant further workup with a repeat ultrasound. Unfortunately this is not test that we can obtain in the emergency department.  I have given you a referral to the physician of the woman of [place], associated with [place].  Please call them to schedule an appointment.  Their contact information provided on your discharge paperwork.  Additionally, I recommend continuing to alternate between Tylenol  and ibuprofen every 6 hours.  I also suggest to continue wearing a sports bra, as this does help with your symptoms.  Please return to the emergency department for any worsening symptoms or any other life-threatening illness.

## 2024-09-14 NOTE — ED Notes (Signed)
 Patient transported to CT
